# Patient Record
Sex: Male | Born: 1985 | Race: White | Hispanic: No | Marital: Single | Smoking: Never smoker
Health system: Southern US, Community
[De-identification: ages and names within clinical notes are randomized; demographics above are authoritative.]

## PROBLEM LIST (undated history)

## (undated) DIAGNOSIS — I10 Essential (primary) hypertension: Secondary | ICD-10-CM

## (undated) DIAGNOSIS — K219 Gastro-esophageal reflux disease without esophagitis: Secondary | ICD-10-CM

## (undated) DIAGNOSIS — F419 Anxiety disorder, unspecified: Secondary | ICD-10-CM

## (undated) HISTORY — PX: ABDOMINAL SURGERY: SHX537

## (undated) HISTORY — PX: OTHER SURGICAL HISTORY: SHX169

---

## 2017-08-23 ENCOUNTER — Encounter (HOSPITAL_COMMUNITY): Payer: Self-pay

## 2017-08-23 ENCOUNTER — Emergency Department (HOSPITAL_COMMUNITY): Payer: PRIVATE HEALTH INSURANCE

## 2017-08-23 ENCOUNTER — Other Ambulatory Visit: Payer: Self-pay

## 2017-08-23 ENCOUNTER — Emergency Department (HOSPITAL_COMMUNITY)
Admission: EM | Admit: 2017-08-23 | Discharge: 2017-08-24 | Disposition: A | Discharge status: Home / Self Care | Payer: PRIVATE HEALTH INSURANCE | Attending: Emergency Medicine | Admitting: Emergency Medicine

## 2017-08-23 DIAGNOSIS — R569 Unspecified convulsions: Secondary | ICD-10-CM | POA: Insufficient documentation

## 2017-08-23 DIAGNOSIS — I10 Essential (primary) hypertension: Secondary | ICD-10-CM | POA: Insufficient documentation

## 2017-08-23 HISTORY — DX: Essential (primary) hypertension: I10

## 2017-08-23 HISTORY — DX: Gastro-esophageal reflux disease without esophagitis: K21.9

## 2017-08-23 LAB — DIFFERENTIAL
ABS IMMATURE GRANULOCYTES: 0 10*3/uL (ref 0.0–0.1)
BASOS ABS: 0.1 10*3/uL (ref 0.0–0.1)
BASOS PCT: 1 %
EOS ABS: 0 10*3/uL (ref 0.0–0.7)
EOS PCT: 1 %
IMMATURE GRANULOCYTES: 0 %
LYMPHS ABS: 1.1 10*3/uL (ref 0.7–4.0)
Lymphocytes Relative: 13 %
Monocytes Absolute: 0.9 10*3/uL (ref 0.1–1.0)
Monocytes Relative: 11 %
NEUTROS PCT: 74 %
Neutro Abs: 6.4 10*3/uL (ref 1.7–7.7)

## 2017-08-23 LAB — BASIC METABOLIC PANEL
Anion gap: 16 — ABNORMAL HIGH (ref 5–15)
BUN: 10 mg/dL (ref 6–20)
CALCIUM: 9.6 mg/dL (ref 8.9–10.3)
CO2: 28 mmol/L (ref 22–32)
CREATININE: 0.95 mg/dL (ref 0.61–1.24)
Chloride: 90 mmol/L — ABNORMAL LOW (ref 98–111)
GFR calc non Af Amer: >60 mL/min (ref >60)
Glucose, Bld: 115 mg/dL — ABNORMAL HIGH (ref 70–99)
Potassium: 3.5 mmol/L (ref 3.5–5.1)
SODIUM: 134 mmol/L — AB (ref 135–145)

## 2017-08-23 LAB — CBC
HCT: 45.5 % (ref 39.0–52.0)
Hemoglobin: 15.5 g/dL (ref 13.0–17.0)
MCH: 31.5 pg (ref 26.0–34.0)
MCHC: 34.1 g/dL (ref 30.0–36.0)
MCV: 92.5 fL (ref 78.0–100.0)
PLATELETS: 202 10*3/uL (ref 150–400)
RBC: 4.92 MIL/uL (ref 4.22–5.81)
RDW: 12.9 % (ref 11.5–15.5)
WBC: 8.6 10*3/uL (ref 4.0–10.5)

## 2017-08-23 LAB — MAGNESIUM: MAGNESIUM: 1.8 mg/dL (ref 1.7–2.4)

## 2017-08-23 LAB — CBG MONITORING, ED: Glucose-Capillary: 111 mg/dL — ABNORMAL HIGH (ref 70–99)

## 2017-08-23 MED ORDER — LORAZEPAM 1 MG PO TABS
1.0000 mg | ORAL_TABLET | Freq: Once | ORAL | Status: AC
  Administered 2017-08-23: 1 mg via ORAL
  Filled 2017-08-23: qty 1

## 2017-08-23 NOTE — ED Triage Notes (Signed)
Pt BIB [facility] for eval of seizure episode this evening around 2000 lasting approx 5 minutes according to bystanders w/ NO hx of seziures. Pt reports he takes xanax daily and has not missed any doses. Pt arrives w/ GCS 15 and A&Ox4, EMS reports prolonged post-ictal period lasting approx 30-40 minutes.

## 2017-08-23 NOTE — ED Provider Notes (Signed)
[facility]   CSN: [id number] Arrival date & time: 08/23/17  2059  History   Chief Complaint Chief Complaint  Patient presents with  . Seizures    HPI Jenkins RougeBrent Gurka is a 32 y.o. male.  HPI 32 year old male with history of hypertension, GERD, and alcohol dependence presents to the emergency department today for evaluation of seizure-like activity.  Reports he was leaving work today whenever he felt lightheaded and nauseated.  Reportedly had a approx 5-minute episode of seizure-like activity that was witnessed by bystanders.  Had approximately 30 to 40-minute postictal period following event.  Received no medications prior to arrival.  Has been alert and oriented with EMS.  Patient complaining of headache.  No witnesses available in the ED for collateral.  Patient uncertain if he struck his head.  No lacerations or abrasions.  No history of seizures in the past.  No history of alcohol-related complications or hospitalizations in the past.  Reports he drinks probably 3-4 liquor drinks nightly.  Often takes Xanax daily while at work and is uncertain if he took it today.  Typically takes 0.5 mg each morning at work.  Typically gets off work at 3p however did not get to leave until 9p tonight. No fevers or chills.  No chest pain or shortness of breath.  No recent falls, traumas or accidents prior to today's event. In ED complaining of tremulousness and "my nerves are really bad." No SI/HI.   Past Medical History:  Diagnosis Date  . GERD (gastroesophageal reflux disease)   . HTN (hypertension)     There are no active problems to display for this patient.   History reviewed. No pertinent surgical history.      Home Medications    Prior to Admission medications   Medication Sig Start Date End Date Taking? Authorizing Provider  ALPRAZolam (XANAX) 0.25 MG tablet Take 0.25 mg by mouth 2 (two) times daily. 08/07/17  Yes [provider]  omeprazole (PRILOSEC OTC) 20 MG tablet Take 20 mg by mouth daily.   Yes [provider]    Family History History reviewed. No pertinent family history.  Social History Social History   Tobacco Use  . Smoking status: Never Smoker  . Smokeless tobacco: Never Used  Substance Use Topics  . Alcohol use: Yes    Comment: 3-4 shots of vodka every night  . Drug use: Never     Allergies   Patient has no known allergies.   Review of Systems Review of Systems  Constitutional: Negative for chills and fever.  HENT: Negative for congestion.   Eyes: Negative for visual disturbance.  Respiratory: Negative for cough and shortness of breath.   Cardiovascular: Negative for chest pain and leg swelling.  Gastrointestinal: Positive for nausea. Negative for abdominal pain, diarrhea and vomiting.  Genitourinary: Negative for dysuria.  Musculoskeletal: Negative for back pain and neck pain.  Skin: Negative for rash.  Neurological: Positive for seizures, light-headedness and headaches. Negative for weakness and numbness.       Tremulousness  Psychiatric/Behavioral: The patient is nervous/anxious.      Physical Exam Updated Vital Signs BP (!) 144/96   Pulse (!) 106   Temp 98.4 F (36.9 C)   Resp 14   Ht 6\' 3"  (1.905 m)   Wt 93 kg   SpO2 95%   BMI 25.62 kg/m   Physical Exam  Constitutional: He is oriented to person, place, and time. No distress.  HENT:  Head: Normocephalic and atraumatic.  Eyes: Conjunctivae are normal. Right eye exhibits no discharge. Left eye exhibits no discharge.  Neck: Normal range of motion. No tracheal deviation present.  Cardiovascular: Regular rhythm, normal heart sounds and intact distal pulses.  Pulmonary/Chest: Effort normal and breath sounds normal. No respiratory distress.  Abdominal: Soft. Bowel sounds are normal. He exhibits no distension. There is no tenderness.  Musculoskeletal: He exhibits no edema or deformity.    Neurological: He is alert and oriented to person, place, and time. He exhibits normal muscle tone.  Fine tremor diffusely. Alert and oriented x3.  Cranial nerves II-XII intact.  No facial asymmetry.  5/5 grip strength bilaterally.  5/5 bicep flexion and tricep extension bilaterally. 5/5 flexion/extension at knee, hip, and ankles. No discoordination of FNF, heel to shin, and ambulates without ataxia or antalgic gait.    Skin: No rash noted. He is not diaphoretic.  Psychiatric: He has a normal mood and affect.     ED Treatments / Results  Labs (all labs ordered are listed, but only abnormal results are displayed) Labs Reviewed  BASIC METABOLIC PANEL - Abnormal; Notable for the following components:      Result Value   Sodium 134 (*)    Chloride 90 (*)    Glucose, Bld 115 (*)    Anion gap 16 (*)    All other components within normal limits  CBG MONITORING, ED - Abnormal; Notable for the following components:   Glucose-Capillary 111 (*)    All other components within normal limits  CBC  MAGNESIUM  DIFFERENTIAL    EKG EKG Interpretation  Date/Time:  Friday August 23 2017 21:06:38 EDT Ventricular Rate:  116 PR Interval:    QRS Duration: 102 QT Interval:  339 QTC Calculation: 471 R Axis:   59 Text Interpretation:  Sinus tachycardia Borderline T abnormalities, inferior leads Borderline prolonged QT interval No old tracing to compare Confirmed by Pricilla Loveless [phone]) on 08/23/2017 9:28:13 PM   Radiology Ct Head Wo Contrast  Result Date: 08/23/2017 CLINICAL DATA:  Seizure this evening.  No history of seizures. EXAM: CT HEAD WITHOUT CONTRAST TECHNIQUE: Contiguous axial images were obtained from the base of the skull through the vertex without intravenous contrast. COMPARISON:  None. FINDINGS: Brain: Normal appearing cerebral hemispheres and posterior fossa structures. Normal size and position of the ventricles. No intracranial hemorrhage, mass lesion or CT evidence of acute  infarction. Vascular: No hyperdense vessel or unexpected calcification. Skull: Normal. Negative for fracture or focal lesion. Sinuses/Orbits: Unremarkable. Other: None. IMPRESSION: Normal examination. Electronically Signed   By: Beckie Salts M.D.   On: 08/23/2017 22:05    Procedures Procedures (including critical care time)  Medications Ordered in ED Medications  LORazepam (ATIVAN) tablet 1 mg (1 mg Oral Given 08/23/17 2137)     Initial Impression / Assessment and Plan / ED Course  I have reviewed the triage vital signs and the nursing notes.  Pertinent labs & imaging results that were available during my care of the patient were reviewed by me and considered in my medical decision making (see chart for details).    32 year old male with history of hypertension, GERD, and alcohol dependence presents to the emergency department today for evaluation of seizure-like activity.   Patient afebrile, initially tachycardic and tremulous on presentation.  No focal neurological deficits.  Ambulates without difficulty.  Uncertain if he struck his head.  Will obtain CT head given new onset seizures and possible head injury with headache. No infectious symptoms  to suggest meningitis/encephalitis. We will also check CBC and metabolic panel to evaluate for organic etiologies.  Suspect likely EtOH/benzo withdrawal.  No history of similar withdrawal or seizures in the past.  Not actively hallucinating.  CT head obtained shows no acute intercranial ab normality.  No mass.  No bleed.  CBC and CMP reviewed. CBC unremarkable. CMP remarkable only for chloride 90 and AGAP 16. Tolerating PO now in ED. Pt given PO ativan with resolution of tremor and tachycardia. Favor benzo/alcohol dependence and possibly withdrawal causing seizure. Pt counseled on this. Not interested in quitting now. Advised [facility] for assistance. Also counseled on seizure precautions (do not drive, etc) until follow up with neuro. Neuro  contact info provided. No other concerns at this time. Stable at discharge and clinically sober with clear speech and steady gait.   Case and plan of care discussed with Dr. Criss Alvine.  Final Clinical Impressions(s) / ED Diagnoses   Final diagnoses:  Seizure [facility])    ED Discharge Orders    None       Rigoberto Noel, MD 08/23/17 Joseph Pierini    Pricilla Loveless, MD 08/24/17 [phone]

## 2017-08-23 NOTE — Discharge Instructions (Signed)
Follow-up with your primary care doctor for reevaluation.  Continue taking home medications as previously prescribed.  Do not drive, operate heavy machinery, swim alone, or climb ladders or heights until you have followed up with neurology for seizure.  Recommend talking with [facility] or other drug/alcohol recovery center for alcohol dependence.

## 2018-11-05 ENCOUNTER — Other Ambulatory Visit: Payer: Self-pay

## 2018-11-05 ENCOUNTER — Emergency Department (HOSPITAL_COMMUNITY): Payer: PRIVATE HEALTH INSURANCE

## 2018-11-05 ENCOUNTER — Emergency Department (HOSPITAL_COMMUNITY)
Admission: EM | Admit: 2018-11-05 | Discharge: 2018-11-06 | Discharge status: Against Medical Advice | Payer: PRIVATE HEALTH INSURANCE | Attending: Emergency Medicine | Admitting: Emergency Medicine

## 2018-11-05 ENCOUNTER — Encounter (HOSPITAL_COMMUNITY): Payer: Self-pay

## 2018-11-05 DIAGNOSIS — R000 Tachycardia, unspecified: Secondary | ICD-10-CM | POA: Diagnosis not present

## 2018-11-05 DIAGNOSIS — R7989 Other specified abnormal findings of blood chemistry: Secondary | ICD-10-CM

## 2018-11-05 DIAGNOSIS — I10 Essential (primary) hypertension: Secondary | ICD-10-CM | POA: Diagnosis not present

## 2018-11-05 DIAGNOSIS — Z79899 Other long term (current) drug therapy: Secondary | ICD-10-CM | POA: Diagnosis not present

## 2018-11-05 DIAGNOSIS — R197 Diarrhea, unspecified: Secondary | ICD-10-CM | POA: Insufficient documentation

## 2018-11-05 DIAGNOSIS — F419 Anxiety disorder, unspecified: Secondary | ICD-10-CM | POA: Insufficient documentation

## 2018-11-05 DIAGNOSIS — R1110 Vomiting, unspecified: Secondary | ICD-10-CM

## 2018-11-05 DIAGNOSIS — R2681 Unsteadiness on feet: Secondary | ICD-10-CM | POA: Insufficient documentation

## 2018-11-05 HISTORY — DX: Anxiety disorder, unspecified: F41.9

## 2018-11-05 LAB — COMPREHENSIVE METABOLIC PANEL
ALT: 71 U/L — ABNORMAL HIGH (ref 0–44)
AST: 108 U/L — ABNORMAL HIGH (ref 15–41)
Albumin: 4.6 g/dL (ref 3.5–5.0)
Alkaline Phosphatase: 62 U/L (ref 38–126)
Anion gap: 12 (ref 5–15)
BUN: 7 mg/dL (ref 6–20)
CO2: 27 mmol/L (ref 22–32)
Calcium: 9.6 mg/dL (ref 8.9–10.3)
Chloride: 94 mmol/L — ABNORMAL LOW (ref 98–111)
Creatinine, Ser: 0.94 mg/dL (ref 0.61–1.24)
GFR calc Af Amer: >60 mL/min (ref >60)
GFR calc non Af Amer: >60 mL/min (ref >60)
Glucose, Bld: 110 mg/dL — ABNORMAL HIGH (ref 70–99)
Potassium: 4 mmol/L (ref 3.5–5.1)
Sodium: 133 mmol/L — ABNORMAL LOW (ref 135–145)
Total Bilirubin: 2.4 mg/dL — ABNORMAL HIGH (ref 0.3–1.2)
Total Protein: 8.8 g/dL — ABNORMAL HIGH (ref 6.5–8.1)

## 2018-11-05 LAB — URINALYSIS, ROUTINE W REFLEX MICROSCOPIC
Bilirubin Urine: NEGATIVE
Glucose, UA: NEGATIVE mg/dL
Ketones, ur: NEGATIVE mg/dL
Leukocytes,Ua: NEGATIVE
Nitrite: NEGATIVE
Protein, ur: 100 mg/dL — AB
Specific Gravity, Urine: 1.01 (ref 1.005–1.030)
pH: 6 (ref 5.0–8.0)

## 2018-11-05 LAB — CBC
HCT: 52.5 % — ABNORMAL HIGH (ref 39.0–52.0)
Hemoglobin: 18 g/dL — ABNORMAL HIGH (ref 13.0–17.0)
MCH: 32.6 pg (ref 26.0–34.0)
MCHC: 34.3 g/dL (ref 30.0–36.0)
MCV: 95.1 fL (ref 80.0–100.0)
Platelets: 159 10*3/uL (ref 150–400)
RBC: 5.52 MIL/uL (ref 4.22–5.81)
RDW: 12.8 % (ref 11.5–15.5)
WBC: 5 10*3/uL (ref 4.0–10.5)
nRBC: 0 % (ref 0.0–0.2)

## 2018-11-05 LAB — LIPASE, BLOOD: Lipase: 111 U/L — ABNORMAL HIGH (ref 11–51)

## 2018-11-05 MED ORDER — LACTATED RINGERS IV BOLUS
1000.0000 mL | Freq: Once | INTRAVENOUS | Status: AC
  Administered 2018-11-05: 1000 mL via INTRAVENOUS

## 2018-11-05 MED ORDER — LORAZEPAM 2 MG/ML IJ SOLN
2.0000 mg | Freq: Once | INTRAMUSCULAR | Status: AC
  Administered 2018-11-05: 2 mg via INTRAVENOUS
  Filled 2018-11-05: qty 1

## 2018-11-05 MED ORDER — LORAZEPAM 2 MG/ML IJ SOLN
1.0000 mg | Freq: Once | INTRAMUSCULAR | Status: AC
  Administered 2018-11-05: 1 mg via INTRAVENOUS
  Filled 2018-11-05: qty 1

## 2018-11-05 MED ORDER — ONDANSETRON HCL 4 MG/2ML IJ SOLN
4.0000 mg | Freq: Once | INTRAMUSCULAR | Status: AC
  Administered 2018-11-05: 4 mg via INTRAVENOUS
  Filled 2018-11-05: qty 2

## 2018-11-05 MED ORDER — CHLORDIAZEPOXIDE HCL 25 MG PO CAPS
50.0000 mg | ORAL_CAPSULE | Freq: Once | ORAL | Status: AC
  Administered 2018-11-05: 50 mg via ORAL
  Filled 2018-11-05: qty 2

## 2018-11-05 MED ORDER — SODIUM CHLORIDE 0.9% FLUSH
3.0000 mL | Freq: Once | INTRAVENOUS | Status: AC
  Administered 2018-11-05: 3 mL via INTRAVENOUS

## 2018-11-05 NOTE — ED Provider Notes (Signed)
[facility]-EMERGENCY DEPT Provider Note   CSN: [id number] Arrival date & time: 11/05/18  1708     History   Chief Complaint Chief Complaint  Patient presents with  . Emesis  . Diarrhea    HPI Connor Lopez is a 33 y.o. male.     HPI  33 year old male presents with vomiting.  Has been ongoing for about 2 or 3 days.  He is having a hard time sleeping due to this.  He can keep down Gatorade but water or food he vomits back up.  No hematemesis.  Had a little bit of diarrhea earlier but that has resolved.  No abdominal pain.  He is feeling pretty anxious, which he states is due to being in the hospital itself.  He drinks about 3 shots of alcohol but not every day.  He has not had any recently and slowly weaned himself off.  However he thinks his anxiety is a recurrent anxiety and problem rather than withdrawal. Typically takes 0.25 mg xanax BID but states it's not helping his anxiety/sleep.  Past Medical History:  Diagnosis Date  . Anxiety   . GERD (gastroesophageal reflux disease)   . HTN (hypertension)     There are no active problems to display for this patient.   Past Surgical History:  Procedure Laterality Date  . ABDOMINAL SURGERY    . head surgery          Home Medications    Prior to Admission medications   Medication Sig Start Date End Date Taking? Authorizing Provider  ALPRAZolam (XANAX) 0.25 MG tablet Take 0.25 mg by mouth 2 (two) times daily as needed for anxiety.  08/07/17  Yes [provider]  lisinopril (ZESTRIL) 10 MG tablet Take 10 mg by mouth daily. 08/13/18  Yes [provider]  pantoprazole (PROTONIX) 40 MG tablet Take 40 mg by mouth 2 (two) times daily.   Yes [provider]    Family History Family History  Family history unknown: Yes    Social History Social History   Tobacco Use  . Smoking status: Never Smoker  . Smokeless tobacco: Never Used  Substance Use Topics  . Alcohol use: Yes   . Drug use: Never     Allergies   Klonopin [clonazepam]   Review of Systems Review of Systems  Constitutional: Negative for fever.  Respiratory: Negative for cough and shortness of breath.   Gastrointestinal: Positive for diarrhea, nausea and vomiting. Negative for abdominal pain.  Psychiatric/Behavioral: The patient is nervous/anxious.   All other systems reviewed and are negative.    Physical Exam Updated Vital Signs BP (!) 142/113   Pulse (!) 103   Temp 98 F (36.7 C) (Oral)   Resp 18   Ht 6\' 2"  (1.88 m)   Wt 105.2 kg   SpO2 96%   BMI 29.79 kg/m   Physical Exam Vitals signs and nursing note reviewed.  Constitutional:      Appearance: He is well-developed.  HENT:     Head: Normocephalic and atraumatic.     Right Ear: External ear normal.     Left Ear: External ear normal.     Nose: Nose normal.  Eyes:     General:        Right eye: No discharge.        Left eye: No discharge.  Neck:     Musculoskeletal: Neck supple.  Cardiovascular:     Rate and Rhythm: Regular rhythm. Tachycardia present.  Heart sounds: Normal heart sounds.  Pulmonary:     Effort: Pulmonary effort is normal.     Breath sounds: Normal breath sounds.  Abdominal:     Palpations: Abdomen is soft.     Tenderness: There is no abdominal tenderness.  Skin:    General: Skin is warm and dry.  Neurological:     Mental Status: He is alert.     Motor: No tremor.  Psychiatric:        Mood and Affect: Mood is anxious.      ED Treatments / Results  Labs (all labs ordered are listed, but only abnormal results are displayed) Labs Reviewed  LIPASE, BLOOD - Abnormal; Notable for the following components:      Result Value   Lipase 111 (*)    All other components within normal limits  COMPREHENSIVE METABOLIC PANEL - Abnormal; Notable for the following components:   Sodium 133 (*)    Chloride 94 (*)    Glucose, Bld 110 (*)    Total Protein 8.8 (*)    AST 108 (*)    ALT 71 (*)    Total  Bilirubin 2.4 (*)    All other components within normal limits  CBC - Abnormal; Notable for the following components:   Hemoglobin 18.0 (*)    HCT 52.5 (*)    All other components within normal limits  URINALYSIS, ROUTINE W REFLEX MICROSCOPIC - Abnormal; Notable for the following components:   Color, Urine AMBER (*)    Hgb urine dipstick SMALL (*)    Protein, ur 100 (*)    Bacteria, UA RARE (*)    All other components within normal limits    EKG None  Radiology No results found.  Procedures Procedures (including critical care time)  Medications Ordered in ED Medications  chlordiazePOXIDE (LIBRIUM) capsule 50 mg (has no administration in time range)  sodium chloride flush (NS) 0.9 % injection 3 mL (3 mLs Intravenous Given 11/05/18 2047)  lactated ringers bolus 1,000 mL (0 mLs Intravenous Stopped 11/05/18 2134)  lactated ringers bolus 1,000 mL (1,000 mLs Intravenous Bolus from Bag 11/05/18 2134)  LORazepam (ATIVAN) injection 1 mg (1 mg Intravenous Given 11/05/18 2047)  ondansetron (ZOFRAN) injection 4 mg (4 mg Intravenous Given 11/05/18 2047)  LORazepam (ATIVAN) injection 2 mg (2 mg Intravenous Given 11/05/18 2132)     Initial Impression / Assessment and Plan / ED Course  I have reviewed the triage vital signs and the nursing notes.  Pertinent labs & imaging results that were available during my care of the patient were reviewed by me and considered in my medical decision making (see chart for details).        Family is here and states that he last drank Sunday, about 3 days ago.  Symptoms have started since then.  Some tremors.  He probably does have a mild alcohol withdrawal and his CIWA is a 9.  He will be given Ativan and started on some Librium.  I think with the abnormal LFTs and mild bump in lipase, will get right upper quadrant ultrasound.  However this could all be viral and/or alcohol related. Care transferred to [facility].  Final Clinical Impressions(s) /  ED Diagnoses   Final diagnoses:  Vomiting in adult    ED Discharge Orders    None       Sherwood Gambler, MD 11/05/18 2207

## 2018-11-05 NOTE — ED Notes (Signed)
Ultrasound bedside.

## 2018-11-05 NOTE — ED Notes (Signed)
PT SPOKE WITH THIS WRITER AND IS VERY NERVOUS. HE DOES NOT LIKE HOSPITALS AND DID NOT TAKE HIS ANXIETY MEDICATION TODAY. PT REQUESTED TO STEP OUTSIDE TO HELP ANXIETY. ENCOURAGED PT TO NOTIFY THIS WRITER OR REGISTRATION SO WE CAN BETTER ASSIST WITH HIS PLAN OF CARE.

## 2018-11-05 NOTE — ED Notes (Signed)
Pt slightly unsteady on feet. Pt requesting to use restroom. RN offered the urinal or to use the wheelchair to wheel patient to bathroom door. Pt refused. Pt educated that staff wants to prevent fall. Britni Henderley, PA

## 2018-11-05 NOTE — ED Provider Notes (Signed)
Care transferred from Dr. Criss Alvine.  See note for full HPI.  In summation 33 year old male presents for evaluation of emesis.  Has been ongoing over the last 2 to 3 days.  States he can keep down Gatorade however has emesis with solid food.  No hematemesis, no history of esophageal varices.  He does drink about 3 shots of liquor however not daily.  He has been slowly trying to wean himself.  He denies prior history of DTs or withdrawal seizures.  He typically takes 0.25 mg Xanax twice daily or does not help with his anxiety.  With elevated LFTs on labs.  Plan right upper quadrant ultrasound, Librium, reevaluation of tachycardia.  Likely DC home.  Physical Exam  BP (!) 141/106   Pulse 100   Temp 98 F (36.7 C) (Oral)   Resp 18   Ht 6\' 2"  (1.88 m)   Wt 105.2 kg   SpO2 96%   BMI 29.79 kg/m   Physical Exam Vitals signs and nursing note reviewed.  Constitutional:      General: He is not in acute distress.    Appearance: He is well-developed. He is not ill-appearing, toxic-appearing or diaphoretic.  HENT:     Head: Normocephalic and atraumatic.     Nose: Nose normal.     Mouth/Throat:     Mouth: Mucous membranes are moist.     Pharynx: Oropharynx is clear.  Eyes:     Pupils: Pupils are equal, round, and reactive to light.  Neck:     Musculoskeletal: Normal range of motion and neck supple.  Cardiovascular:     Rate and Rhythm: Normal rate and regular rhythm.     Pulses: Normal pulses.     Heart sounds: Normal heart sounds.  Pulmonary:     Effort: Pulmonary effort is normal. No respiratory distress.  Abdominal:     General: Bowel sounds are normal. There is no distension.     Palpations: Abdomen is soft.     Tenderness: There is no abdominal tenderness. There is no right CVA tenderness, left CVA tenderness, guarding or rebound.  Musculoskeletal: Normal range of motion.  Skin:    General: Skin is warm and dry.  Neurological:     Mental Status: He is alert.    ED  Course/Procedures     Procedures Abdomen Limited Ruq  Result Date: 11/05/2018 CLINICAL DATA:  Vomiting EXAM: ULTRASOUND ABDOMEN LIMITED RIGHT UPPER QUADRANT COMPARISON:  None. FINDINGS: Gallbladder: No gallstones or wall thickening visualized. No sonographic Murphy sign noted by sonographer. Common bile duct: Diameter: 6 mm Liver: Increased echotexture seen throughout. No focal abnormality or biliary ductal dilatation. Portal vein is patent on color Doppler imaging with normal direction of blood flow towards the liver. Other: None. IMPRESSION: Hepatic steatosis. Nonspecific mild prominence to the common bile duct. Normal appearing gallbladder. Electronically Signed   By: 11/07/2018 M.D.   On: 11/05/2018 22:19   Labs Reviewed  LIPASE, BLOOD - Abnormal; Notable for the following components:      Result Value   Lipase 111 (*)    All other components within normal limits  COMPREHENSIVE METABOLIC PANEL - Abnormal; Notable for the following components:   Sodium 133 (*)    Chloride 94 (*)    Glucose, Bld 110 (*)    Total Protein 8.8 (*)    AST 108 (*)    ALT 71 (*)    Total Bilirubin 2.4 (*)    All other components within normal limits  CBC - Abnormal; Notable for the following components:   Hemoglobin 18.0 (*)    HCT 52.5 (*)    All other components within normal limits  URINALYSIS, ROUTINE W REFLEX MICROSCOPIC - Abnormal; Notable for the following components:   Color, Urine AMBER (*)    Hgb urine dipstick SMALL (*)    Protein, ur 100 (*)    Bacteria, UA RARE (*)    All other components within normal limits   MDM   Care transferred from Dr. Regenia Skeeter.  See note for full HPI.  In summation 33 year old male presents for evaluation of emesis.  Has been ongoing over the last 2 to 3 days.  States he can keep down Gatorade however has emesis with solid food.  No hematemesis, no history of esophageal varices.  He does drink about 3 shots of liquor however not daily.  He has been slowly  trying to wean himself.  He denies prior history of DTs or withdrawal seizures.  He typically takes 0.25 mg Xanax twice daily or does not help with his anxiety.  With elevated LFTs on labs.  Plan right upper quadrant ultrasound, Librium, reevaluation of tachycardia.  Likely DC home.  Initial evaluation patient has removed his IV and is standing in the doorway.  States he would like to go home now.  He does not want any further evaluation.  Discussed with patient his ultrasound did show mild nonspecific dilation of the CBD duct.  Discussed that there could be an obstructed stone which is been causing his emesis as well as elevated lipase level.  Discussed with patient I would like to consult with the gastroenterologist for possible additional imaging.  Patient states he does not want any of this done and would like to go home.  He has been able to take sips of ginger ale without emesis in the ED.  Discussed with patient that an obstructed stone could lead to infection, worsening pain.  Discussed risk versus benefit and of leaving the emergency department prior to completion of exam.  He voices understanding is chosen to leave [facility].  We discussed the nature and purpose, risks and benefits, as well as, the alternatives of treatment. Time was given to allow the opportunity to ask questions and consider their options, and after the discussion, the patient decided to refuse the offerred treatment. The patient was informed that refusal could lead to, but was not limited to, death, permanent disability, or severe pain. If present, I asked the relatives or significant others to dissuade them without success. Prior to refusing, I determined that the patient had the capacity to make their decision and understood the consequences of that decision. After refusal, I made every reasonable opportunity to treat them to the best of my ability.  The patient was notified that they may return to the emergency  department at any time for further treatment.         Shyonna Carlin A, PA-C 11/05/18 2341    Fredia Sorrow, MD 11/12/18 [phone]

## 2018-11-05 NOTE — ED Triage Notes (Signed)
Patient c/o emesis and diarrhea x 2 days. Patient denies any abdominal pain.  Patient states he is very anxious. Patient stated, "I do not like hospitals."  Patient states he normally drinks daily, and has been tapering down patient states he has not drank in 2 weeks.

## 2019-02-03 ENCOUNTER — Emergency Department (HOSPITAL_COMMUNITY)
Admission: EM | Admit: 2019-02-03 | Discharge: 2019-02-03 | Disposition: A | Discharge status: Home / Self Care | Payer: 59 | Attending: Emergency Medicine | Admitting: Emergency Medicine

## 2019-02-03 ENCOUNTER — Encounter (HOSPITAL_COMMUNITY): Payer: Self-pay | Admitting: Emergency Medicine

## 2019-02-03 ENCOUNTER — Emergency Department (HOSPITAL_COMMUNITY): Payer: 59

## 2019-02-03 ENCOUNTER — Other Ambulatory Visit: Payer: Self-pay

## 2019-02-03 DIAGNOSIS — Z79899 Other long term (current) drug therapy: Secondary | ICD-10-CM | POA: Diagnosis not present

## 2019-02-03 DIAGNOSIS — Y929 Unspecified place or not applicable: Secondary | ICD-10-CM | POA: Insufficient documentation

## 2019-02-03 DIAGNOSIS — Y939 Activity, unspecified: Secondary | ICD-10-CM | POA: Diagnosis not present

## 2019-02-03 DIAGNOSIS — Y999 Unspecified external cause status: Secondary | ICD-10-CM | POA: Insufficient documentation

## 2019-02-03 DIAGNOSIS — I10 Essential (primary) hypertension: Secondary | ICD-10-CM | POA: Insufficient documentation

## 2019-02-03 DIAGNOSIS — M546 Pain in thoracic spine: Secondary | ICD-10-CM

## 2019-02-03 DIAGNOSIS — W010XXA Fall on same level from slipping, tripping and stumbling without subsequent striking against object, initial encounter: Secondary | ICD-10-CM | POA: Diagnosis not present

## 2019-02-03 DIAGNOSIS — Z20822 Contact with and (suspected) exposure to covid-19: Secondary | ICD-10-CM | POA: Diagnosis not present

## 2019-02-03 DIAGNOSIS — S22080A Wedge compression fracture of T11-T12 vertebra, initial encounter for closed fracture: Secondary | ICD-10-CM | POA: Diagnosis not present

## 2019-02-03 DIAGNOSIS — R569 Unspecified convulsions: Secondary | ICD-10-CM

## 2019-02-03 DIAGNOSIS — F10239 Alcohol dependence with withdrawal, unspecified: Secondary | ICD-10-CM | POA: Diagnosis not present

## 2019-02-03 LAB — CBC WITH DIFFERENTIAL/PLATELET
Abs Immature Granulocytes: 0.04 10*3/uL (ref 0.00–0.07)
Basophils Absolute: 0.1 10*3/uL (ref 0.0–0.1)
Basophils Relative: 1 %
Eosinophils Absolute: 0 10*3/uL (ref 0.0–0.5)
Eosinophils Relative: 0 %
HCT: 47.2 % (ref 39.0–52.0)
Hemoglobin: 16.1 g/dL (ref 13.0–17.0)
Immature Granulocytes: 1 %
Lymphocytes Relative: 7 %
Lymphs Abs: 0.5 10*3/uL — ABNORMAL LOW (ref 0.7–4.0)
MCH: 33.3 pg (ref 26.0–34.0)
MCHC: 34.1 g/dL (ref 30.0–36.0)
MCV: 97.7 fL (ref 80.0–100.0)
Monocytes Absolute: 0.5 10*3/uL (ref 0.1–1.0)
Monocytes Relative: 8 %
Neutro Abs: 5.8 10*3/uL (ref 1.7–7.7)
Neutrophils Relative %: 83 %
Platelets: 131 10*3/uL — ABNORMAL LOW (ref 150–400)
RBC: 4.83 MIL/uL (ref 4.22–5.81)
RDW: 12.6 % (ref 11.5–15.5)
WBC: 7 10*3/uL (ref 4.0–10.5)
nRBC: 0 % (ref 0.0–0.2)

## 2019-02-03 LAB — COMPREHENSIVE METABOLIC PANEL
ALT: 72 U/L — ABNORMAL HIGH (ref 0–44)
AST: 114 U/L — ABNORMAL HIGH (ref 15–41)
Albumin: 4.5 g/dL (ref 3.5–5.0)
Alkaline Phosphatase: 58 U/L (ref 38–126)
Anion gap: 10 (ref 5–15)
BUN: 8 mg/dL (ref 6–20)
CO2: 27 mmol/L (ref 22–32)
Calcium: 9.2 mg/dL (ref 8.9–10.3)
Chloride: 96 mmol/L — ABNORMAL LOW (ref 98–111)
Creatinine, Ser: 0.7 mg/dL (ref 0.61–1.24)
GFR calc Af Amer: >60 mL/min (ref >60)
GFR calc non Af Amer: >60 mL/min (ref >60)
Glucose, Bld: 110 mg/dL — ABNORMAL HIGH (ref 70–99)
Potassium: 4.3 mmol/L (ref 3.5–5.1)
Sodium: 133 mmol/L — ABNORMAL LOW (ref 135–145)
Total Bilirubin: 1.6 mg/dL — ABNORMAL HIGH (ref 0.3–1.2)
Total Protein: 7.9 g/dL (ref 6.5–8.1)

## 2019-02-03 LAB — POC SARS CORONAVIRUS 2 AG -  ED: SARS Coronavirus 2 Ag: NEGATIVE

## 2019-02-03 LAB — RESPIRATORY PANEL BY RT PCR (FLU A&B, COVID)
Influenza A by PCR: NEGATIVE
Influenza B by PCR: NEGATIVE
SARS Coronavirus 2 by RT PCR: NEGATIVE

## 2019-02-03 LAB — ETHANOL: Alcohol, Ethyl (B): <10 mg/dL (ref <10)

## 2019-02-03 MED ORDER — THIAMINE HCL 100 MG/ML IJ SOLN: 100.0000 mg | Freq: Every day | INTRAMUSCULAR | Status: DC

## 2019-02-03 MED ORDER — HYDROCODONE-ACETAMINOPHEN 5-325 MG PO TABS: 1.0000 | ORAL_TABLET | ORAL | 0 refills | Status: DC | PRN

## 2019-02-03 MED ORDER — SODIUM CHLORIDE (PF) 0.9 % IJ SOLN
INTRAMUSCULAR | Status: AC
  Filled 2019-02-03: qty 50

## 2019-02-03 MED ORDER — LORAZEPAM 1 MG PO TABS: 0.0000 mg | ORAL_TABLET | Freq: Two times a day (BID) | ORAL | Status: DC

## 2019-02-03 MED ORDER — HYDROMORPHONE HCL 1 MG/ML IJ SOLN
1.0000 mg | Freq: Once | INTRAMUSCULAR | Status: AC
  Administered 2019-02-03: 1 mg via INTRAVENOUS
  Filled 2019-02-03: qty 1

## 2019-02-03 MED ORDER — LORAZEPAM 2 MG/ML IJ SOLN
0.0000 mg | Freq: Four times a day (QID) | INTRAMUSCULAR | Status: DC
  Administered 2019-02-03 (×2): 1 mg via INTRAVENOUS
  Filled 2019-02-03 (×2): qty 1

## 2019-02-03 MED ORDER — LORAZEPAM 2 MG/ML IJ SOLN
2.0000 mg | Freq: Once | INTRAMUSCULAR | Status: AC
  Administered 2019-02-03: 2 mg via INTRAVENOUS
  Filled 2019-02-03: qty 1

## 2019-02-03 MED ORDER — LORAZEPAM 2 MG/ML IJ SOLN: 0.0000 mg | Freq: Two times a day (BID) | INTRAMUSCULAR | Status: DC

## 2019-02-03 MED ORDER — KETOROLAC TROMETHAMINE 15 MG/ML IJ SOLN
15.0000 mg | Freq: Once | INTRAMUSCULAR | Status: AC
  Administered 2019-02-03: 15 mg via INTRAVENOUS
  Filled 2019-02-03: qty 1

## 2019-02-03 MED ORDER — IOHEXOL 350 MG/ML SOLN
100.0000 mL | Freq: Once | INTRAVENOUS | Status: AC | PRN
  Administered 2019-02-03: 100 mL via INTRAVENOUS

## 2019-02-03 MED ORDER — LORAZEPAM 1 MG PO TABS: 0.0000 mg | ORAL_TABLET | Freq: Four times a day (QID) | ORAL | Status: DC

## 2019-02-03 MED ORDER — HYDROCODONE-ACETAMINOPHEN 5-325 MG PO TABS
1.0000 | ORAL_TABLET | Freq: Once | ORAL | Status: AC
  Administered 2019-02-03: 1 via ORAL
  Filled 2019-02-03: qty 1

## 2019-02-03 MED ORDER — THIAMINE HCL 100 MG/ML IJ SOLN
Freq: Once | INTRAVENOUS | Status: AC
  Filled 2019-02-03: qty 1000

## 2019-02-03 MED ORDER — THIAMINE HCL 100 MG PO TABS
100.0000 mg | ORAL_TABLET | Freq: Every day | ORAL | Status: DC
  Administered 2019-02-03: 100 mg via ORAL
  Filled 2019-02-03: qty 1

## 2019-02-03 NOTE — ED Provider Notes (Signed)
Patient continues have back pain and was tachycardic but that improved after some Ativan.  The patient was unable to ambulate due to significant pain in his back.  We decided to order lumbar spine films and he has T11 and T12 compression fractures along with T3.  Would order a CT scan of his thoracic spine along with lumbar spine.  Patient may need admission for further control of pain and PT.  Wait to hear from neurosurgery.   Charlestine Night, PA-C 02/03/19 1413    Milagros Loll, MD 02/04/19 [phone]

## 2019-02-03 NOTE — ED Notes (Addendum)
Pt not able to ambulate 15 ft.  Pt complained of pain so severe that ambulation was not possible.  Pt's O2 was 93 and Pulse at 134.

## 2019-02-03 NOTE — ED Provider Notes (Signed)
Care transferred to from Lawyer, PA-C at shift change. See note for full HPI.  In summation 34 year old with alcohol abuse with possible W/D seizure who presents for evaluation after witnessed seizure at work. Has been seen in Ed for seizure before. Initially hypoxic CTA chest neg, No UR symptoms. COVID negative here. Hypoxia resolved, previous provider thought likely due to abnormal connection in leads. He did however complain of back pain. Compression fx on xray CT thoracic and lumbar pending.Vinny Costella PA- C with NS to evaluate, likly dc home if can ambulate with brace however NS will evaluate patient in ED regardless.   "Patient to ED with reported seizure x 1 tonight while at home. The patient does not have memory of events and gives permission to discuss his condition with [facility], significant other. She states the patient had a single seizure tonight that lasted about 2 minutes. She describes a post-ictal period of confusion. On chart review, he has had one seizure in the past felt likely related to alcohol withdrawal. The patient denies heavy alcohol use, "only on the weekends". [name] reports he drinks 10 'airplane' bottles of vodka every night and the last time he drank was 02/01/19 (seizure 02/02/19). [name] reports that EMS was called when he had the seizure but he refused transportation at that time. A short time after, he developed sharp, severe bilateral mid-back pain and EMS was called a second time and transported him to the ED. She states that she assisted him to the ground during the seizure but he may have hit his back on the open drawer of the dresser. He was able to get himself up and back in bed without pain, which started shortly after. He denies headache, nausea, SOB although a deep breath causes his back pain to worsen. No cough. No recent illness. "  CIWA 3, has not had to get Ativan here. Patient denies sx of W/D at this time.   Physical Exam  BP (!) 146/104   Pulse (!) 110    Temp 99.7 F (37.6 C) (Oral)   Resp 18   Ht 6\' 2"  (1.88 m)   Wt 105.2 kg   SpO2 94%   BMI 29.79 kg/m   Physical Exam Vitals and nursing note reviewed.  Constitutional:      General: He is not in acute distress.    Appearance: He is well-developed. He is not ill-appearing, toxic-appearing or diaphoretic.  HENT:     Head: Normocephalic and atraumatic.     Nose: Nose normal.     Mouth/Throat:     Mouth: Mucous membranes are moist.     Pharynx: Oropharynx is clear.  Eyes:     Pupils: Pupils are equal, round, and reactive to light.  Cardiovascular:     Rate and Rhythm: Regular rhythm. Tachycardia present.     Heart sounds: Normal heart sounds.     Comments: Mild tachy to 104 in room Pulmonary:     Effort: Pulmonary effort is normal. No respiratory distress.     Breath sounds: Normal breath sounds.     Comments: Clear to auscultation bilateral without wheeze, rhonchi or rales. Abdominal:     General: There is no distension.     Palpations: Abdomen is soft.     Comments: Soft, nontender without rebound or guarding  Musculoskeletal:        General: Normal range of motion.     Cervical back: Normal range of motion and neck supple.  Comments: Tenderness to lumbar spine.  Patient T SLO brace.  Skin:    General: Skin is warm and dry.     Capillary Refill: Capillary refill takes less than 2 seconds.     Comments: Capillary refill.  No evidence of DVT, no edema, erythema or warmth.  No tremors to extremities.  Neurological:     Mental Status: He is alert.     Comments: Negative finger to nose, heel to shin, cranial 2-12 grossly intact.  Ambulatory in room without difficulty.     ED Course/Procedures   Clinical Course as of Feb 02 1654  Tue Feb 03, 2019  [address], [place] with NS has evaluated, likely dc home after CT if pain controlled. If needs admission for pain control would like Hosp to admit to [facility]   [BH]    Clinical Course User Index [BH] Vashawn Ekstein A, PA-C     Procedures Labs Reviewed  CBC WITH DIFFERENTIAL/PLATELET - Abnormal; Notable for the following components:      Result Value   Platelets 131 (*)    Lymphs Abs 0.5 (*)    All other components within normal limits  COMPREHENSIVE METABOLIC PANEL - Abnormal; Notable for the following components:   Sodium 133 (*)    Chloride 96 (*)    Glucose, Bld 110 (*)    AST 114 (*)    ALT 72 (*)    Total Bilirubin 1.6 (*)    All other components within normal limits  RESPIRATORY PANEL BY RT PCR (FLU A&B, COVID)  ETHANOL  POC SARS CORONAVIRUS 2 AG -  ED  DG Lumbar Spine Complete  Result Date: 02/03/2019 CLINICAL DATA:  Seizure with fall and low back pain. EXAM: LUMBAR SPINE - COMPLETE 4+ VIEW COMPARISON:  Chest CT 02/03/2019 FINDINGS: Anterior wedge compression fracture involving the T12 vertebral body with approximately 50% loss of height. There is also a compression fracture along the superior endplate of Z02 with mild height loss. Degenerative endplate changes at H8-N2. Probable large Schmorl's node along the inferior endplate of L3 but difficult to exclude a compression fracture involving the L3 vertebral body. Alignment of lumbar spine is normal. Mild disc space narrowing at L2-L3. No evidence for a pars defect. IMPRESSION: 1. Compression fracture involving the T12 vertebral body with at least 50% loss of height. 2. Compression fracture involving the superior endplate of D78 with mild vertebral body height loss. 3. Cannot exclude compression fractures at L3. Electronically Signed   By: Markus Daft M.D.   On: 02/03/2019 13:42   CT Angio Chest PE W and/or Wo Contrast  Result Date: 02/03/2019 CLINICAL DATA:  Hypoxemia. Additional history provided: Patient fell at home, history of severe ETOH abuse, right back pain. EXAM: CT ANGIOGRAPHY CHEST WITH CONTRAST TECHNIQUE: Multidetector CT imaging of the chest was performed using the standard protocol during bolus administration of intravenous contrast.  Multiplanar CT image reconstructions and MIPs were obtained to evaluate the vascular anatomy. CONTRAST:  112mL OMNIPAQUE IOHEXOL 350 MG/ML SOLN COMPARISON:  Chest radiograph 02/03/2019 FINDINGS: CARDIOVASCULAR: Normal heart size.No pericardial effusion.No thoracic aortic aneurysm. Satisfactory opacification of the pulmonary arteries to the segmental level. Evaluation for pulmonary embolus is slightly limited due to respiratory motion artifact, particularly at the level of the lung bases. No pulmonary artery filling defect is identified to suggest pulmonary embolus. MEDIASTINUM/NODES: No enlarged mediastinal, hilar or axillary lymph nodes. The visualized thyroid gland is unremarkable. LUNGS/PLEURA: Mild opacity within the dependent aspect of the right upper lobe and  right lower lobe, as well as left lower lobe, with an appearance most consistent with incomplete atelectasis. No pleural effusion or pneumothorax. The central airways are grossly patent. UPPER ABDOMEN: Severe hepatic steatosis. MUSCULOSKELETAL: No acute bony abnormality. Review of the MIP images confirms the above findings. IMPRESSION: Evaluation for pulmonary embolus is slightly limited by respiratory motion artifact at the level of the lung bases. No pulmonary embolus is identified. Mild pulmonary opacities within the dependent right upper and lower lobes, as well as left lower lobe, with an appearance most consistent with incomplete atelectasis. Pneumonia is difficult to definitively exclude. Severe hepatic steatosis. Electronically Signed   By: Jackey Loge DO   On: 02/03/2019 07:32   CT Lumbar Spine Wo Contrast  Result Date: 02/03/2019 CLINICAL DATA:  34 year old male with low back pain. EXAM: CT LUMBAR SPINE WITHOUT CONTRAST TECHNIQUE: Multidetector CT imaging of the lumbar spine was performed without intravenous contrast administration. Multiplanar CT image reconstructions were also generated. COMPARISON:  Chest CT dated 02/02/2019 and lumbar  spine radiograph dated 02/03/2019. FINDINGS: Segmentation: 5 lumbar type vertebrae. Alignment: No acute subluxation.  Normal lumbar lordosis. Vertebrae: No acute lumbar spine fracture. Minimal old compression fracture of the inferior endplate of L3 versus a Schmorl's node. There is compression fracture of the superior endplate of T12 with greater than 50% loss of vertebral body height. This is likely acute or possibly subacute. There is transmits component of the fracture that extends to the anterior vertebral body cortex. There is approximately 7 mm retropulsion of the superior posterior cortex with associated moderate focal narrowing of the central canal. There is also mild compression fracture of the superior endplate of T11 with less than 20% loss of vertebral body height centrally. No retropulsed fragment noted at this level. The visualized posterior elements appear intact. Paraspinal and other soft tissues: No paravertebral fluid collection or hematoma. 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 There is symmetric excretion of contrast by both kidneys. There is fatty infiltration of the liver. Disc levels: Mild degenerative changes and bone spur at L2-L3. The neural foramina appear patent. IMPRESSION: 1. No acute/traumatic lumbar spine pathology. 2. Compression fracture of the T12 with greater than 50% loss of vertebral body height, likely acute or possibly subacute. There is a 7 mm retropulsed fragment of the superior posterior cortex with associated moderate focal narrowing of the central canal. 3. Compression fracture of the superior endplate of T11 with less than 20% loss of vertebral body height. No retropulsed fragment at this level. 4. Fatty liver and horseshoe kidney. Electronically Signed   By: Elgie Collard M.D.   On: 02/03/2019 16:19   DG Chest Portable 1 View  Result Date: 02/03/2019 CLINICAL DATA:  Chest pain EXAM: PORTABLE CHEST 1 VIEW COMPARISON:   None. FINDINGS: Shallow lung inflation with right basilar atelectasis. Cardiomediastinal contours are normal. No pleural effusion or pneumothorax. IMPRESSION: Right basilar atelectasis. Electronically Signed   By: Deatra Robinson M.D.   On: 02/03/2019 02:00   MDM  FU CT scan. Plan to likely dc home with brace after CT scan.  Labs personally reviewed and reassuring CTA chest reassuring Plain film lumbar with possible compression fractures, patient awaiting CT.  Neurosurgery has been consulted and they will evaluate.  Patient under CIWA protocol  1 dose of Ativan this morning however none over last 8 hours in ED stay.  He does have some mild tachycardia however no chest pain, shortness of breath.  Hypoxia self  resolved and he is 95% on room air with good waveform with ambulation.  Does not appear to be clinically in withdrawal on my evaluation.  Patient denies any upper respiratory symptoms.  Covid negative.  Neurosurgery, then he has reviewed the CT scan.  See note.  Patient follow-up outpatient.   Patient with reassuring imaging.  Seizure likely due to alcohol withdrawal.  He denies wanting to quit or Librium outpatient.  I have given him outpatient resources.  Will give small dose pain medicine given his compression fractures.  Here with reassuring neurologic exam without any focal deficits.  Lab and follow-up outpatient PCP.  Low suspicion for intracranial mass, hemorrhage, SAH as cause of his symptoms.  Tolerating p.o. intake.  Patient is adamant about discharging home.  He is ambulatory in room without difficulty.  He is not very forthright with his alcohol use however per previous provider notes patient with extensive alcoholism.  The patient has been appropriately medically screened and/or stabilized in the ED. I have low suspicion for any other emergent medical condition which would require further screening, evaluation or treatment in the ED or require inpatient management.  Patient is  hemodynamically stable and in no acute distress.  Patient able to ambulate in department prior to ED.  Evaluation does not show acute pathology that would require ongoing or additional emergent interventions while in the emergency department or further inpatient treatment.  I have discussed the diagnosis with the patient and answered all questions.  Pain is been managed while in the emergency department and patient has no further complaints prior to discharge.  Patient is comfortable with plan discussed in room and is stable for discharge at this time.  I have discussed strict return precautions for returning to the emergency department.  Patient was encouraged to follow-up with PCP/specialist refer to at discharge.       Rumaldo Difatta A, PA-C 02/03/19 1657    Lorre Nick, MD 02/03/19 1951

## 2019-02-03 NOTE — ED Notes (Signed)
Ortho tech called for TSLO brace. Ortho tech will order TSLO brace and may take a 1.5 hours.

## 2019-02-03 NOTE — Discharge Instructions (Addendum)
Follow up with Neurosurgery follow-up with neurosurgery ugery.,Aloe up with neurosurgery.

## 2019-02-03 NOTE — ED Provider Notes (Signed)
[facility]-EMERGENCY DEPT Provider Note   CSN: [id number] Arrival date & time: 02/03/19  0043     History No chief complaint on file.   Connor Lopez is a 34 y.o. male.  Patient to ED with reported seizure x 1 tonight while at home. The patient does not have memory of events and gives permission to discuss his condition with [facility], significant other. She states the patient had a single seizure tonight that lasted about 2 minutes. She describes a post-ictal period of confusion. On chart review, he has had one seizure in the past felt likely related to alcohol withdrawal. The patient denies heavy alcohol use, "only on the weekends". [name] reports he drinks 10 'airplane' bottles of vodka every night and the last time he drank was 02/01/19 (seizure 02/02/19). [name] reports that EMS was called when he had the seizure but he refused transportation at that time. A short time after, he developed sharp, severe bilateral mid-back pain and EMS was called a second time and transported him to the ED. She states that she assisted him to the ground during the seizure but he may have hit his back on the open drawer of the dresser. He was able to get himself up and back in bed without pain, which started shortly after. He denies headache, nausea, SOB although a deep breath causes his back pain to worsen. No cough. No recent illness.   The history is provided by the patient and a significant other. No language interpreter was used.       Past Medical History:  Diagnosis Date  . Anxiety   . GERD (gastroesophageal reflux disease)   . HTN (hypertension)     There are no problems to display for this patient.   Past Surgical History:  Procedure Laterality Date  . ABDOMINAL SURGERY    . head surgery         Family History  Family history unknown: Yes    Social History   Tobacco Use  . Smoking status: Never Smoker  . Smokeless tobacco: Never Used  Substance Use  Topics  . Alcohol use: Yes  . Drug use: Never    Home Medications Prior to Admission medications   Medication Sig Start Date End Date Taking? Authorizing Provider  ALPRAZolam (XANAX) 0.25 MG tablet Take 0.25 mg by mouth 2 (two) times daily as needed for anxiety.  08/07/17  Yes [provider]  lisinopril (ZESTRIL) 10 MG tablet Take 10 mg by mouth daily. 08/13/18  Yes [provider]    Allergies    Klonopin [clonazepam]  Review of Systems   Review of Systems  Constitutional: Negative for chills and fever.  HENT: Negative.  Negative for congestion.   Respiratory: Negative.  Negative for cough and shortness of breath.   Cardiovascular: Negative.   Gastrointestinal: Negative.  Negative for abdominal pain and vomiting.  Musculoskeletal: Positive for back pain.  Skin: Negative.   Neurological: Positive for seizures. Negative for weakness and headaches.    Physical Exam Updated Vital Signs BP (!) 150/101   Pulse (!) 106   Temp 99.7 F (37.6 C) (Oral)   Resp (!) 24   Ht 6\' 2"  (1.88 m)   Wt 105.2 kg   SpO2 93%   BMI 29.79 kg/m   Physical Exam Vitals and nursing note reviewed.  Constitutional:      General: He is not in acute distress.    Appearance: He is well-developed.  HENT:  Head: Normocephalic.  Cardiovascular:     Rate and Rhythm: Regular rhythm. Tachycardia present.  Pulmonary:     Effort: Pulmonary effort is normal.     Breath sounds: Normal breath sounds. No wheezing, rhonchi or rales.  Chest:     Chest wall: No tenderness.  Abdominal:     General: Bowel sounds are normal.     Palpations: Abdomen is soft.     Tenderness: There is no abdominal tenderness. There is no guarding or rebound.  Musculoskeletal:        General: No tenderness, deformity or signs of injury. Normal range of motion.     Cervical back: Normal range of motion and neck supple.       Back:     Comments: No bruising, redness or abrasion to back. No midline spinal  tenderness.   Skin:    General: Skin is warm and dry.     Findings: No rash.  Neurological:     General: No focal deficit present.     Mental Status: He is alert and oriented to person, place, and time.     ED Results / Procedures / Treatments   Labs (all labs ordered are listed, but only abnormal results are displayed) Labs Reviewed  CBC WITH DIFFERENTIAL/PLATELET  ETHANOL  COMPREHENSIVE METABOLIC PANEL  POC SARS CORONAVIRUS 2 AG -  ED   Results for orders placed or performed during the hospital encounter of 02/03/19  CBC with Differential  Result Value Ref Range   WBC 7.0 4.0 - 10.5 K/uL   RBC 4.83 4.22 - 5.81 MIL/uL   Hemoglobin 16.1 13.0 - 17.0 g/dL   HCT 47.0 96.2 - 83.6 %   MCV 97.7 80.0 - 100.0 fL   MCH 33.3 26.0 - 34.0 pg   MCHC 34.1 30.0 - 36.0 g/dL   RDW 62.9 47.6 - 54.6 %   Platelets 131 (L) 150 - 400 K/uL   nRBC 0.0 0.0 - 0.2 %   Neutrophils Relative % 83 %   Neutro Abs 5.8 1.7 - 7.7 K/uL   Lymphocytes Relative 7 %   Lymphs Abs 0.5 (L) 0.7 - 4.0 K/uL   Monocytes Relative 8 %   Monocytes Absolute 0.5 0.1 - 1.0 K/uL   Eosinophils Relative 0 %   Eosinophils Absolute 0.0 0.0 - 0.5 K/uL   Basophils Relative 1 %   Basophils Absolute 0.1 0.0 - 0.1 K/uL   Immature Granulocytes 1 %   Abs Immature Granulocytes 0.04 0.00 - 0.07 K/uL  Ethanol  Result Value Ref Range   Alcohol, Ethyl (B) <10 <10 mg/dL  Comprehensive metabolic panel  Result Value Ref Range   Sodium 133 (L) 135 - 145 mmol/L   Potassium 4.3 3.5 - 5.1 mmol/L   Chloride 96 (L) 98 - 111 mmol/L   CO2 27 22 - 32 mmol/L   Glucose, Bld 110 (H) 70 - 99 mg/dL   BUN 8 6 - 20 mg/dL   Creatinine, Ser 5.03 0.61 - 1.24 mg/dL   Calcium 9.2 8.9 - 54.6 mg/dL   Total Protein 7.9 6.5 - 8.1 g/dL   Albumin 4.5 3.5 - 5.0 g/dL   AST 568 (H) 15 - 41 U/L   ALT 72 (H) 0 - 44 U/L   Alkaline Phosphatase 58 38 - 126 U/L   Total Bilirubin 1.6 (H) 0.3 - 1.2 mg/dL   GFR calc non Af Amer >60 >60 mL/min   GFR calc Af Amer  >60 >60 mL/min  Anion gap 10 5 - 15    EKG None  Radiology DG Chest Portable 1 View  Result Date: 02/03/2019 CLINICAL DATA:  Chest pain EXAM: PORTABLE CHEST 1 VIEW COMPARISON:  None. FINDINGS: Shallow lung inflation with right basilar atelectasis. Cardiomediastinal contours are normal. No pleural effusion or pneumothorax. IMPRESSION: Right basilar atelectasis. Electronically Signed   By: Ulyses Jarred M.D.   On: 02/03/2019 02:00    Procedures Procedures (including critical care time)  Medications Ordered in ED Medications  LORazepam (ATIVAN) injection 0-4 mg (1 mg Intravenous Given 02/03/19 0357)    Or  LORazepam (ATIVAN) tablet 0-4 mg ( Oral See Alternative 02/03/19 0357)  LORazepam (ATIVAN) injection 0-4 mg (has no administration in time range)    Or  LORazepam (ATIVAN) tablet 0-4 mg (has no administration in time range)  thiamine tablet 100 mg (has no administration in time range)    Or  thiamine (B-1) injection 100 mg (has no administration in time range)  sodium chloride 0.9 % 1,000 mL with thiamine 419 mg, folic acid 1 mg, multivitamins adult 10 mL infusion ( Intravenous New Bag/Given 02/03/19 0358)  ketorolac (TORADOL) 15 MG/ML injection 15 mg (15 mg Intravenous Given 02/03/19 0353)    ED Course  I have reviewed the triage vital signs and the nursing notes.  Pertinent labs & imaging results that were available during my care of the patient were reviewed by me and considered in my medical decision making (see chart for details).    MDM Rules/Calculators/A&P                      4:50 - recheck - patient getting banana bag now. Still tachycardic to 115. He is on O2 at 4L and O2 saturation 89-91%. O2 turned to 6L = 94%. Heightened concern for PE. Will obtain CTA PE study. Labs remain pending.  Labs are stable. Mildly elevated LFT's, which are improved from previous. CTA pending. He is under CIWA protocol.  COVID still pending - ordered 2 hours ago, still uncollected.    Patient care signed out to [facility], PA-C, pending completion of work up.   DDx: alcohol withdrawal vs PE vs COVID  Final Clinical Impression(s) / ED Diagnoses Final diagnoses:  None   1. Back pain 2. Seizure 3. Alcohol dependence.  Rx / DC Orders ED Discharge Orders    None       Charlann Lange, Hershal Coria 02/03/19 Jenkins, April, MD 02/03/19 [phone]

## 2019-02-03 NOTE — Consult Note (Signed)
Chief Complaint   No chief complaint on file.   HPI   Consult requested by: chris lawyer PA-C, EDP [facility] Reason for consult: thoracic and lumbar compression fractures  HPI: Connor Lopez is a 34 y.o. male who presented to the ED with severe lower back pain. Patient reportedly had a ?witnessed seizure last night that lasted about 2 minutes, ?etoh w/d seizure. EMS was called initially but patient declined transport. Unfortunately, he developed severe lower back pain shortly there after and called EMS a second time for transport for evaluation. He underwent Xray of lumbar spine which revealed T11, T12 and possible L3 compression fractures. NSY consultation requested.   Complains mostly of bilateral lower back pain. No significant midline pain. No radicular symptoms or weakness in legs. No bowel/bladder dysfunction. Has already been fitted for brace. Has not tried to ambulate yet. Eager for d/c home.  There are no problems to display for this patient.   PMH: Past Medical History:  Diagnosis Date  . Anxiety   . GERD (gastroesophageal reflux disease)   . HTN (hypertension)     PSH: Past Surgical History:  Procedure Laterality Date  . ABDOMINAL SURGERY    . head surgery      (Not in a hospital admission)   SH: Social History   Tobacco Use  . Smoking status: Never Smoker  . Smokeless tobacco: Never Used  Substance Use Topics  . Alcohol use: Yes  . Drug use: Never    MEDS: Prior to Admission medications   Medication Sig Start Date End Date Taking? Authorizing Provider  ALPRAZolam (XANAX) 0.25 MG tablet Take 0.25 mg by mouth 2 (two) times daily as needed for anxiety.  08/07/17  Yes [provider]  lisinopril (ZESTRIL) 10 MG tablet Take 10 mg by mouth daily. 08/13/18  Yes [provider]    ALLERGY: Allergies  Allergen Reactions  . Klonopin [Clonazepam] Nausea Only    Social History   Tobacco Use  . Smoking status: Never Smoker  . Smokeless  tobacco: Never Used  Substance Use Topics  . Alcohol use: Yes     Family History  Family history unknown: Yes     ROS   Review of Systems  Constitutional: Negative.   HENT: Negative.   Eyes: Negative.   Respiratory: Negative.   Cardiovascular: Negative.   Gastrointestinal: Negative.   Genitourinary: Negative.   Musculoskeletal: Positive for back pain and myalgias.  Skin: Negative.   Neurological: Positive for seizures. Negative for dizziness, tingling, tremors, sensory change, speech change, focal weakness, weakness and headaches.    Exam   Vitals:   02/03/19 1505 02/03/19 1515  BP:    Pulse: (!) 105 (!) 110  Resp:    Temp:    SpO2: 90% 94%   General appearance: WDWN, NAD, in TLSO brace Eyes: No scleral injection Cardiovascular: Regular rate and rhythm without murmurs, rubs, gallops. No edema or variciosities. Distal pulses normal. Pulmonary: Effort normal, non-labored breathing Musculoskeletal:     Muscle tone upper extremities: Normal    Muscle tone lower extremities: Normal    Motor exam: Upper Extremities Deltoid Bicep Tricep Grip  Right 5/5 5/5 5/5 5/5  Left 5/5 5/5 5/5 5/5   Lower Extremity IP Quad PF DF EHL  Right 5/5 5/5 5/5 5/5 5/5  Left 5/5 5/5 5/5 5/5 5/5   Neurological Mental Status:    - Patient is awake, alert, oriented to person, place, month, year, and situation    - Patient is  able to give a clear and coherent history.    - No signs of aphasia or neglect Cranial Nerves    - II: Visual Fields are full. PERRL    - III/IV/VI: EOMI without ptosis or diploplia.     - V: Facial sensation is grossly normal    - VII: Facial movement is symmetric.     - VIII: hearing is intact to voice    - X: Uvula elevates symmetrically    - XI: Shoulder shrug is symmetric.    - XII: tongue is midline without atrophy or fasciculations.  Sensory: Sensation grossly intact to LT Deep Tendon Reflexes    - 2+ and symmetric in the biceps and patellae.   Results  - Imaging/Labs   Results for orders placed or performed during the hospital encounter of 02/03/19 (from the past 48 hour(s))  CBC with Differential     Status: Abnormal   Collection Time: 02/03/19  5:08 AM  Result Value Ref Range   WBC 7.0 4.0 - 10.5 K/uL   RBC 4.83 4.22 - 5.81 MIL/uL   Hemoglobin 16.1 13.0 - 17.0 g/dL   HCT 47.2 39.0 - 52.0 %   MCV 97.7 80.0 - 100.0 fL   MCH 33.3 26.0 - 34.0 pg   MCHC 34.1 30.0 - 36.0 g/dL   RDW 12.6 11.5 - 15.5 %   Platelets 131 (L) 150 - 400 K/uL   nRBC 0.0 0.0 - 0.2 %   Neutrophils Relative % 83 %   Neutro Abs 5.8 1.7 - 7.7 K/uL   Lymphocytes Relative 7 %   Lymphs Abs 0.5 (L) 0.7 - 4.0 K/uL   Monocytes Relative 8 %   Monocytes Absolute 0.5 0.1 - 1.0 K/uL   Eosinophils Relative 0 %   Eosinophils Absolute 0.0 0.0 - 0.5 K/uL   Basophils Relative 1 %   Basophils Absolute 0.1 0.0 - 0.1 K/uL   Immature Granulocytes 1 %   Abs Immature Granulocytes 0.04 0.00 - 0.07 K/uL    Comment: Performed at [facility], [place] [address], [place], [place] [address]  Ethanol     Status: None   Collection Time: 02/03/19  5:08 AM  Result Value Ref Range   Alcohol, Ethyl (B) <10 <10 mg/dL    Comment: (NOTE) Lowest detectable limit for serum alcohol is 10 mg/dL. For medical purposes only. Performed at [facility], [place] [address] [place], [place] [address]   Comprehensive metabolic panel     Status: Abnormal   Collection Time: 02/03/19  5:08 AM  Result Value Ref Range   Sodium 133 (L) 135 - 145 mmol/L   Potassium 4.3 3.5 - 5.1 mmol/L   Chloride 96 (L) 98 - 111 mmol/L   CO2 27 22 - 32 mmol/L   Glucose, Bld 110 (H) 70 - 99 mg/dL   BUN 8 6 - 20 mg/dL   Creatinine, Ser 0.70 0.61 - 1.24 mg/dL   Calcium 9.2 8.9 - 10.3 mg/dL   Total Protein 7.9 6.5 - 8.1 g/dL   Albumin 4.5 3.5 - 5.0 g/dL   AST 114 (H) 15 - 41 U/L   ALT 72 (H) 0 - 44 U/L   Alkaline Phosphatase 58 38 - 126 U/L   Total Bilirubin 1.6 (H) 0.3 - 1.2 mg/dL    GFR calc non Af Amer >60 >60 mL/min   GFR calc Af Amer >60 >60 mL/min   Anion gap 10 5 - 15    Comment:  Performed at [facility], [place] [address], [place], [place] [address]  POC SARS Coronavirus 2 Ag-ED - Nasal Swab (BD Veritor Kit)     Status: None   Collection Time: 02/03/19  7:39 AM  Result Value Ref Range   SARS Coronavirus 2 Ag NEGATIVE NEGATIVE    Comment: (NOTE) SARS-CoV-2 antigen NOT DETECTED.  Negative results are presumptive.  Negative results do not preclude SARS-CoV-2 infection and should not be used as the sole basis for treatment or other patient management decisions, including infection  control decisions, particularly in the presence of clinical signs and  symptoms consistent with COVID-19, or in those who have been in contact with the virus.  Negative results must be combined with clinical observations, patient history, and epidemiological information. The expected result is Negative. Fact Sheet for Patients: PodPark.tn Fact Sheet for Healthcare Providers: GiftContent.is This test is not yet approved or cleared by the [place] FDA and  has been authorized for detection and/or diagnosis of SARS-CoV-2 by FDA under an Emergency Use Authorization (EUA).  This EUA will remain in effect (meaning this test can be used) for the duration of  the COVID-19 de claration under Section 564(b)(1) of the Act, 21 U.S.C. section 360bbb-3(b)(1), unless the authorization is terminated or revoked sooner.   Respiratory Panel by RT PCR (Flu A&B, Covid) - Nasopharyngeal Swab     Status: None   Collection Time: 02/03/19 10:33 AM   Specimen: Nasopharyngeal Swab  Result Value Ref Range   SARS Coronavirus 2 by RT PCR NEGATIVE NEGATIVE    Comment: (NOTE) SARS-CoV-2 target nucleic acids are NOT DETECTED. The SARS-CoV-2 RNA is generally detectable in upper respiratoy specimens during the acute phase of  infection. The lowest concentration of SARS-CoV-2 viral copies this assay can detect is 131 copies/mL. A negative result does not preclude SARS-Cov-2 infection and should not be used as the sole basis for treatment or other patient management decisions. A negative result may occur with  improper specimen collection/handling, submission of specimen other than nasopharyngeal swab, presence of viral mutation(s) within the areas targeted by this assay, and inadequate number of viral copies (<131 copies/mL). A negative result must be combined with clinical observations, patient history, and epidemiological information. The expected result is Negative. Fact Sheet for Patients:  PinkCheek.be Fact Sheet for Healthcare Providers:  GravelBags.it This test is not yet ap proved or cleared by the [place] FDA and  has been authorized for detection and/or diagnosis of SARS-CoV-2 by FDA under an Emergency Use Authorization (EUA). This EUA will remain  in effect (meaning this test can be used) for the duration of the COVID-19 declaration under Section 564(b)(1) of the Act, 21 U.S.C. section 360bbb-3(b)(1), unless the authorization is terminated or revoked sooner.    Influenza A by PCR NEGATIVE NEGATIVE   Influenza B by PCR NEGATIVE NEGATIVE    Comment: (NOTE) The Xpert Xpress SARS-CoV-2/FLU/RSV assay is intended as an aid in  the diagnosis of influenza from Nasopharyngeal swab specimens and  should not be used as a sole basis for treatment. Nasal washings and  aspirates are unacceptable for Xpert Xpress SARS-CoV-2/FLU/RSV  testing. Fact Sheet for Patients: PinkCheek.be Fact Sheet for Healthcare Providers: GravelBags.it This test is not yet approved or cleared by the [place] FDA and  has been authorized for detection and/or diagnosis of SARS-CoV-2 by  FDA under an Emergency  Use Authorization (EUA). This EUA will remain  in effect (meaning this test can be used) for the  duration of the  Covid-19 declaration under Section 564(b)(1) of the Act, 21  U.S.C. section 360bbb-3(b)(1), unless the authorization is  terminated or revoked. Performed at [facility], [place] [address] [place], [place] [address]     DG Lumbar Spine Complete  Result Date: 02/03/2019 CLINICAL DATA:  Seizure with fall and low back pain. EXAM: LUMBAR SPINE - COMPLETE 4+ VIEW COMPARISON:  Chest CT 02/03/2019 FINDINGS: Anterior wedge compression fracture involving the T12 vertebral body with approximately 50% loss of height. There is also a compression fracture along the superior endplate of H06 with mild height loss. Degenerative endplate changes at C3-J6. Probable large Schmorl's node along the inferior endplate of L3 but difficult to exclude a compression fracture involving the L3 vertebral body. Alignment of lumbar spine is normal. Mild disc space narrowing at L2-L3. No evidence for a pars defect. IMPRESSION: 1. Compression fracture involving the T12 vertebral body with at least 50% loss of height. 2. Compression fracture involving the superior endplate of E83 with mild vertebral body height loss. 3. Cannot exclude compression fractures at L3. Electronically Signed   By: Markus Daft M.D.   On: 02/03/2019 13:42   CT Angio Chest PE W and/or Wo Contrast  Result Date: 02/03/2019 CLINICAL DATA:  Hypoxemia. Additional history provided: Patient fell at home, history of severe ETOH abuse, right back pain. EXAM: CT ANGIOGRAPHY CHEST WITH CONTRAST TECHNIQUE: Multidetector CT imaging of the chest was performed using the standard protocol during bolus administration of intravenous contrast. Multiplanar CT image reconstructions and MIPs were obtained to evaluate the vascular anatomy. CONTRAST:  162m OMNIPAQUE IOHEXOL 350 MG/ML SOLN COMPARISON:  Chest radiograph 02/03/2019 FINDINGS: CARDIOVASCULAR:  Normal heart size.No pericardial effusion.No thoracic aortic aneurysm. Satisfactory opacification of the pulmonary arteries to the segmental level. Evaluation for pulmonary embolus is slightly limited due to respiratory motion artifact, particularly at the level of the lung bases. No pulmonary artery filling defect is identified to suggest pulmonary embolus. MEDIASTINUM/NODES: No enlarged mediastinal, hilar or axillary lymph nodes. The visualized thyroid gland is unremarkable. LUNGS/PLEURA: Mild opacity within the dependent aspect of the right upper lobe and right lower lobe, as well as left lower lobe, with an appearance most consistent with incomplete atelectasis. No pleural effusion or pneumothorax. The central airways are grossly patent. UPPER ABDOMEN: Severe hepatic steatosis. MUSCULOSKELETAL: No acute bony abnormality. Review of the MIP images confirms the above findings. IMPRESSION: Evaluation for pulmonary embolus is slightly limited by respiratory motion artifact at the level of the lung bases. No pulmonary embolus is identified. Mild pulmonary opacities within the dependent right upper and lower lobes, as well as left lower lobe, with an appearance most consistent with incomplete atelectasis. Pneumonia is difficult to definitively exclude. Severe hepatic steatosis. Electronically Signed   By: KKellie SimmeringDO   On: 02/03/2019 07:32   DG Chest Portable 1 View  Result Date: 02/03/2019 CLINICAL DATA:  Chest pain EXAM: PORTABLE CHEST 1 VIEW COMPARISON:  None. FINDINGS: Shallow lung inflation with right basilar atelectasis. Cardiomediastinal contours are normal. No pleural effusion or pneumothorax. IMPRESSION: Right basilar atelectasis. Electronically Signed   By: KUlyses JarredM.D.   On: 02/03/2019 02:00   Impression/Plan   34y.o. male with T11, T12 compression fracture after falling due to ?seizure. He is neurologically intact.   Xrays and CT scan of lumbar spine were reviewed.  T11 fracture, 20%  height loss, no retropulsion. T12 fracture, 50% height loss, 729mretropulsion resulting in moderate canal stenosis. Also subsequent mild  developing kyphosis. No indication for emergent NS intervention. Patient is eager for discharge home. Assuming he is able to ambulate without significant pain, he can f/u on an outpatient basis for monitoring in 1 week. He is to wear his TLSO when upright and OOB. This was communicated with patient. Red flag symptoms discussed. Will follow if patient is admitted for pain control. We would prefer transfer to [facility] should admission be necessary.  Please call for any concerns.  Ferne Reus, PA-C [facility] [employer]

## 2019-02-03 NOTE — ED Triage Notes (Signed)
Pt is from home reported to have fallen in [place] while having a  Seizure Hx of severe ETOH abuse and was believed to be going threw withdrawls. Pt c/o mid right back pain r/t fall was ambulatory at scene but now reports unable to ambulate. Given 500 ml NSS IV and 100 mcg fentanyl IV

## 2019-03-05 ENCOUNTER — Ambulatory Visit: Payer: 59 | Admitting: Neurology

## 2019-04-03 ENCOUNTER — Encounter: Payer: Self-pay | Admitting: Neurology

## 2019-04-03 ENCOUNTER — Other Ambulatory Visit: Payer: Self-pay

## 2019-04-03 ENCOUNTER — Ambulatory Visit: Payer: 59 | Admitting: Neurology

## 2019-04-03 DIAGNOSIS — S22080S Wedge compression fracture of T11-T12 vertebra, sequela: Secondary | ICD-10-CM

## 2019-04-03 DIAGNOSIS — G40909 Epilepsy, unspecified, not intractable, without status epilepticus: Secondary | ICD-10-CM

## 2019-04-03 DIAGNOSIS — S22080A Wedge compression fracture of T11-T12 vertebra, initial encounter for closed fracture: Secondary | ICD-10-CM

## 2019-04-03 HISTORY — DX: Epilepsy, unspecified, not intractable, without status epilepticus: G40.909

## 2019-04-03 HISTORY — DX: Wedge compression fracture of T11-T12 vertebra, initial encounter for closed fracture: S22.080A

## 2019-04-03 MED ORDER — ALPRAZOLAM 0.5 MG PO TABS: ORAL_TABLET | ORAL | 0 refills | Status: AC

## 2019-04-03 MED ORDER — ZONISAMIDE 50 MG PO CAPS: ORAL_CAPSULE | ORAL | 3 refills | Status: AC
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
## 2019-04-03 NOTE — Patient Instructions (Signed)
   We will start Zonegran for the seizures. 

## 2019-04-06 ENCOUNTER — Telehealth: Payer: Self-pay | Admitting: Neurology

## 2019-04-06 NOTE — Telephone Encounter (Signed)
[employer] order sent to GI. They will obtain the auth and reach out to the patient to schedule.  

## 2019-04-13 ENCOUNTER — Telehealth: Payer: Self-pay | Admitting: Neurology

## 2019-04-13 ENCOUNTER — Ambulatory Visit (INDEPENDENT_AMBULATORY_CARE_PROVIDER_SITE_OTHER): Payer: 59

## 2019-04-13 ENCOUNTER — Other Ambulatory Visit: Payer: Self-pay

## 2019-04-13 DIAGNOSIS — G40909 Epilepsy, unspecified, not intractable, without status epilepticus: Secondary | ICD-10-CM

## 2019-04-13 NOTE — Procedures (Signed)
    History:  Connor Lopez is a 34 year old gentleman with a history of 2 generalized seizures lifetime.  The first event was on 23 August 2017.  The second event occurred on 03 February 2019.  The patient had gotten up to go to the bathroom and had a witnessed generalized seizure, he sustained a T12 compression fracture with this.  He is being evaluated for the seizures.  This is a routine EEG.  No skull defects are noted.  Medications include diazepam, lisinopril, oxycodone, and Zanaflex.  EEG classification: Normal awake  Description of the recording: The background rhythms of this recording consists of a fairly well modulated medium amplitude alpha rhythm of 11 Hz that is reactive to eye opening and closure. As the record progresses, the patient appears to remain in the waking state throughout the recording. Photic stimulation was performed, resulting in a bilateral and symmetric photic driving response. Hyperventilation was also performed, resulting in a minimal buildup of the background rhythm activities without significant slowing seen. At no time during the recording does there appear to be evidence of spike or spike wave discharges or evidence of focal slowing. EKG monitor shows no evidence of cardiac rhythm abnormalities with a heart rate of 72.  Impression: This is a normal EEG recording in the waking state. No evidence of ictal or interictal discharges are seen.

## 2019-04-13 NOTE — Telephone Encounter (Signed)
I called the patient.  The EEG study was normal.  MRI of the brain is pending. 

## 2019-06-01 ENCOUNTER — Other Ambulatory Visit: Payer: Self-pay | Admitting: Nephrology

## 2019-06-01 DIAGNOSIS — R809 Proteinuria, unspecified: Secondary | ICD-10-CM

## 2019-06-01 DIAGNOSIS — N1831 Chronic kidney disease, stage 3a: Secondary | ICD-10-CM

## 2019-06-01 DIAGNOSIS — N179 Acute kidney failure, unspecified: Secondary | ICD-10-CM

## 2019-06-01 DIAGNOSIS — Q631 Lobulated, fused and horseshoe kidney: Secondary | ICD-10-CM

## 2019-06-01 DIAGNOSIS — I129 Hypertensive chronic kidney disease with stage 1 through stage 4 chronic kidney disease, or unspecified chronic kidney disease: Secondary | ICD-10-CM

## 2019-07-07 ENCOUNTER — Ambulatory Visit: Payer: 59 | Admitting: Adult Health

## 2019-07-07 ENCOUNTER — Encounter: Payer: Self-pay | Admitting: Adult Health

## 2019-11-26 ENCOUNTER — Ambulatory Visit: Payer: 59 | Admitting: Family Medicine

## 2019-11-30 ENCOUNTER — Other Ambulatory Visit: Payer: Self-pay

## 2019-11-30 ENCOUNTER — Encounter (HOSPITAL_COMMUNITY): Payer: Self-pay

## 2019-11-30 ENCOUNTER — Encounter: Payer: Self-pay | Admitting: Family Medicine

## 2019-11-30 ENCOUNTER — Emergency Department (HOSPITAL_COMMUNITY)
Admission: EM | Admit: 2019-11-30 | Discharge: 2019-11-30 | Disposition: A | Discharge status: Home / Self Care | Payer: 59 | Attending: Emergency Medicine | Admitting: Emergency Medicine

## 2019-11-30 DIAGNOSIS — R053 Chronic cough: Secondary | ICD-10-CM | POA: Diagnosis not present

## 2019-11-30 DIAGNOSIS — I10 Essential (primary) hypertension: Secondary | ICD-10-CM | POA: Insufficient documentation

## 2019-11-30 DIAGNOSIS — R7401 Elevation of levels of liver transaminase levels: Secondary | ICD-10-CM | POA: Insufficient documentation

## 2019-11-30 DIAGNOSIS — Z79899 Other long term (current) drug therapy: Secondary | ICD-10-CM | POA: Insufficient documentation

## 2019-11-30 DIAGNOSIS — R112 Nausea with vomiting, unspecified: Secondary | ICD-10-CM | POA: Insufficient documentation

## 2019-11-30 DIAGNOSIS — R7989 Other specified abnormal findings of blood chemistry: Secondary | ICD-10-CM

## 2019-11-30 LAB — COMPREHENSIVE METABOLIC PANEL
ALT: 89 U/L — ABNORMAL HIGH (ref 0–44)
AST: 137 U/L — ABNORMAL HIGH (ref 15–41)
Albumin: 4.8 g/dL (ref 3.5–5.0)
Alkaline Phosphatase: 60 U/L (ref 38–126)
Anion gap: 16 — ABNORMAL HIGH (ref 5–15)
BUN: 24 mg/dL — ABNORMAL HIGH (ref 6–20)
CO2: 23 mmol/L (ref 22–32)
Calcium: 8.9 mg/dL (ref 8.9–10.3)
Chloride: 94 mmol/L — ABNORMAL LOW (ref 98–111)
Creatinine, Ser: 2.18 mg/dL — ABNORMAL HIGH (ref 0.61–1.24)
GFR, Estimated: 39 mL/min — ABNORMAL LOW (ref >60)
Glucose, Bld: 94 mg/dL (ref 70–99)
Potassium: 5.1 mmol/L (ref 3.5–5.1)
Sodium: 133 mmol/L — ABNORMAL LOW (ref 135–145)
Total Bilirubin: 2.3 mg/dL — ABNORMAL HIGH (ref 0.3–1.2)
Total Protein: 8.9 g/dL — ABNORMAL HIGH (ref 6.5–8.1)

## 2019-11-30 LAB — CBC WITH DIFFERENTIAL/PLATELET
Abs Immature Granulocytes: 0 10*3/uL (ref 0.00–0.07)
Basophils Absolute: 0.1 10*3/uL (ref 0.0–0.1)
Basophils Relative: 2 %
Eosinophils Absolute: 0 10*3/uL (ref 0.0–0.5)
Eosinophils Relative: 1 %
HCT: 45.2 % (ref 39.0–52.0)
Hemoglobin: 15.3 g/dL (ref 13.0–17.0)
Immature Granulocytes: 0 %
Lymphocytes Relative: 17 %
Lymphs Abs: 0.8 10*3/uL (ref 0.7–4.0)
MCH: 33.7 pg (ref 26.0–34.0)
MCHC: 33.8 g/dL (ref 30.0–36.0)
MCV: 99.6 fL (ref 80.0–100.0)
Monocytes Absolute: 0.7 10*3/uL (ref 0.1–1.0)
Monocytes Relative: 14 %
Neutro Abs: 3.3 10*3/uL (ref 1.7–7.7)
Neutrophils Relative %: 66 %
Platelets: 149 10*3/uL — ABNORMAL LOW (ref 150–400)
RBC: 4.54 MIL/uL (ref 4.22–5.81)
RDW: 13 % (ref 11.5–15.5)
WBC: 5 10*3/uL (ref 4.0–10.5)
nRBC: 0 % (ref 0.0–0.2)

## 2019-11-30 LAB — LIPASE, BLOOD: Lipase: 131 U/L — ABNORMAL HIGH (ref 11–51)

## 2019-11-30 MED ORDER — ONDANSETRON 4 MG PO TBDP: ORAL_TABLET | ORAL | 0 refills | Status: AC

## 2019-11-30 MED ORDER — LORAZEPAM 1 MG PO TABS: 1.0000 mg | ORAL_TABLET | Freq: Once | ORAL | Status: DC

## 2019-11-30 MED ORDER — LORAZEPAM 2 MG/ML IJ SOLN
1.0000 mg | Freq: Once | INTRAMUSCULAR | Status: AC
  Administered 2019-11-30: 1 mg via INTRAVENOUS
  Filled 2019-11-30: qty 1

## 2019-11-30 MED ORDER — ALUM & MAG HYDROXIDE-SIMETH 200-200-20 MG/5ML PO SUSP
30.0000 mL | Freq: Once | ORAL | Status: AC
  Administered 2019-11-30: 30 mL via ORAL
  Filled 2019-11-30: qty 30

## 2019-11-30 MED ORDER — ONDANSETRON HCL 4 MG/2ML IJ SOLN
4.0000 mg | Freq: Once | INTRAMUSCULAR | Status: AC
  Administered 2019-11-30: 4 mg via INTRAVENOUS
  Filled 2019-11-30: qty 2

## 2019-11-30 MED ORDER — SODIUM CHLORIDE 0.9 % IV BOLUS
1000.0000 mL | Freq: Once | INTRAVENOUS | Status: AC
  Administered 2019-11-30: 1000 mL via INTRAVENOUS

## 2019-11-30 NOTE — Discharge Instructions (Signed)
Your liver function tests were mildly elevated.  This is been seen previously on your blood work.  Typically this is something that would be managed by the family doctor or a gastroenterologist.  If you are on Protonix twice a day then typically a gastroenterologist would be involved in your care.  If you do not have one then I have provided the doctor on call for you to try and see in the office.   Try to avoid things that may make this worse, most commonly these are spicy foods tomato based products fatty foods chocolate and peppermint.  Alcohol and tobacco can also make this worse.  Return to the emergency department for sudden worsening pain fever or inability to eat or drink.

## 2019-11-30 NOTE — ED Triage Notes (Signed)
Pt presents to ED with c/o nausea/vomiting starting 0200. 2-3 episodes of clear liquid emesis. Self-tested for covid at work, negative result. Loss of appetite for 2 days with reduced intake. Denies diarrhea. Took zofran but threw it up. Takes hydrocodone for back, last taken yesterday morning.

## 2019-11-30 NOTE — ED Notes (Signed)
Pt tolerated additional cheese and crackers. Reported improvement in nausea and stated he felt well enough to go home.

## 2019-11-30 NOTE — ED Provider Notes (Signed)
[facility]-EMERGENCY DEPT Provider Note   CSN: [id number] Arrival date & time: 11/30/19  0865     History Chief Complaint  Patient presents with  . Nausea    Story Connor Lopez is a 34 y.o. male.  34 yo M with a chief complaints of nausea and vomiting.  Going on for about 12 hours now.  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  Denies suspicious food intake denies abdominal pain denies fever denies diarrhea.  The history is provided by the patient.  Illness Severity:  Moderate Onset quality:  Gradual Duration:  12 hours Timing:  Constant Progression:  Unchanged Chronicity:  New Associated symptoms: cough (chronic and unchanged), nausea and vomiting   Associated symptoms: no abdominal pain, no chest pain, no congestion, no diarrhea, no fever, no headaches, no myalgias, no rash and no shortness of breath        Past Medical History:  Diagnosis Date  . Anxiety   . GERD (gastroesophageal reflux disease)   . HTN (hypertension)   . Seizure disorder (HCC) 04/03/2019  . T12 compression fracture (HCC) 04/03/2019    Patient Active Problem List   Diagnosis Date Noted  . Seizure disorder (HCC) 04/03/2019  . T12 compression fracture (HCC) 04/03/2019    Past Surgical History:  Procedure Laterality Date  . ABDOMINAL SURGERY    . head surgery         Family History  Family history unknown: Yes    Social History   Tobacco Use  . Smoking status: Never Smoker  . Smokeless tobacco: Never Used  Vaping Use  . Vaping Use: Never used  Substance Use Topics  . Alcohol use: Yes    Comment: daily one  . Drug use: Never    Home Medications Prior to Admission medications   Medication Sig Start Date End Date Taking? Authorizing Provider  ALPRAZolam Prudy Feeler) 0.5 MG tablet Take 2 tablets approximately 45 minutes prior to the MRI study, take a third tablet if needed. 04/03/19   York Spaniel, MD  diazepam  (VALIUM) 5 MG tablet Take 5 mg by mouth at bedtime. 02/26/19   [provider]  lisinopril (ZESTRIL) 10 MG tablet Take 10 mg by mouth daily. 08/13/18   [provider]  ondansetron (ZOFRAN ODT) 4 MG disintegrating tablet 4mg  ODT q4 hours prn nausea/vomit 11/30/19   12/02/19, DO  oxyCODONE-acetaminophen (PERCOCET) 7.5-325 MG tablet Take 1 tablet by mouth every 8 (eight) hours as needed. 02/23/19   [provider]  tizanidine (ZANAFLEX) 2 MG capsule SMARTSIG:1-2 Capsule(s) By Mouth Every 6 Hours 02/23/19   [provider]  zonisamide (ZONEGRAN) 50 MG capsule One capsule twice a day for 2 weeks, then take 2 capsules twice a day 04/03/19   04/05/19, MD    Allergies    Bee venom and Klonopin [clonazepam]  Review of Systems   Review of Systems  Constitutional: Negative for chills and fever.  HENT: Negative for congestion and facial swelling.   Eyes: Negative for discharge and visual disturbance.  Respiratory: Positive for cough (chronic and unchanged). Negative for shortness of breath.   Cardiovascular: Negative for chest pain and palpitations.  Gastrointestinal: Positive for nausea and vomiting. Negative for abdominal pain and diarrhea.  Musculoskeletal: Negative for arthralgias and myalgias.  Skin: Negative for color change and rash.  Neurological: Negative for tremors, syncope and headaches.  Psychiatric/Behavioral: Negative for confusion and dysphoric mood.  Physical Exam Updated Vital Signs BP (!) 132/96   Pulse 89   Temp 97.8 F (36.6 C) (Oral)   Resp 20   Ht 6\' 2"  (1.88 m)   Wt 108.9 kg   SpO2 93%   BMI 30.81 kg/m   Physical Exam Vitals and nursing note reviewed.  Constitutional:      Appearance: He is well-developed.  HENT:     Head: Normocephalic and atraumatic.  Eyes:     Pupils: Pupils are equal, round, and reactive to light.  Neck:     Vascular: No JVD.  Cardiovascular:     Rate and Rhythm: Normal rate and regular  rhythm.     Heart sounds: No murmur heard.  No friction rub. No gallop.   Pulmonary:     Effort: No respiratory distress.     Breath sounds: No wheezing.  Abdominal:     General: There is no distension.     Tenderness: There is no abdominal tenderness. There is no guarding or rebound.  Musculoskeletal:        General: Normal range of motion.     Cervical back: Normal range of motion and neck supple.  Skin:    Coloration: Skin is not pale.     Findings: No rash.  Neurological:     Mental Status: He is alert and oriented to person, place, and time.  Psychiatric:        Behavior: Behavior normal.     ED Results / Procedures / Treatments   Labs (all labs ordered are listed, but only abnormal results are displayed) Labs Reviewed  COMPREHENSIVE METABOLIC PANEL - Abnormal; Notable for the following components:      Result Value   Sodium 133 (*)    Chloride 94 (*)    BUN 24 (*)    Creatinine, Ser 2.18 (*)    Total Protein 8.9 (*)    AST 137 (*)    ALT 89 (*)    Total Bilirubin 2.3 (*)    GFR, Estimated 39 (*)    Anion gap 16 (*)    All other components within normal limits  LIPASE, BLOOD - Abnormal; Notable for the following components:   Lipase 131 (*)    All other components within normal limits  CBC WITH DIFFERENTIAL/PLATELET - Abnormal; Notable for the following components:   Platelets 149 (*)    All other components within normal limits    EKG None  Radiology No results found.  Procedures Procedures (including critical care time)  Medications Ordered in ED Medications  sodium chloride 0.9 % bolus 1,000 mL (1,000 mLs Intravenous New Bag/Given 11/30/19 0933)  ondansetron (ZOFRAN) injection 4 mg (4 mg Intravenous Given 11/30/19 0933)  alum & mag hydroxide-simeth (MAALOX/MYLANTA) 200-200-20 MG/5ML suspension 30 mL (30 mLs Oral Given 11/30/19 1013)  LORazepam (ATIVAN) injection 1 mg (1 mg Intravenous Given 11/30/19 0933)    ED Course  I have reviewed the  triage vital signs and the nursing notes.  Pertinent labs & imaging results that were available during my care of the patient were reviewed by me and considered in my medical decision making (see chart for details).    MDM Rules/Calculators/A&P                          34 yo M with a cc of n/v.  Going on for 12 hours.  Well appearing non toxic.  Will obtain labs, give fluids antiemetics.  Patient has mild LFT elevation.  Appears to be at his baseline.  Lipase is also mildly elevated though not quite at the diagnostic level for pancreatitis.  On repeat exam patient is feeling better and tolerating p.o.  Again denies any abdominal pain.  With him having nausea and vomiting and being around multiple other people at work with the same illness suspect that is likely the cause.  We will have him follow-up with his gastroenterologist.    10:38 AM:  I have discussed the diagnosis/risks/treatment options with the patient and believe the pt to be eligible for discharge home to follow-up with PCP, GI. We also discussed returning to the ED immediately if new or worsening sx occur. We discussed the sx which are most concerning (e.g., sudden worsening pain, fever, inability to tolerate by mouth) that necessitate immediate return. Medications administered to the patient during their visit and any new prescriptions provided to the patient are listed below.  Medications given during this visit Medications  sodium chloride 0.9 % bolus 1,000 mL (1,000 mLs Intravenous New Bag/Given 11/30/19 0933)  ondansetron (ZOFRAN) injection 4 mg (4 mg Intravenous Given 11/30/19 0933)  alum & mag hydroxide-simeth (MAALOX/MYLANTA) 200-200-20 MG/5ML suspension 30 mL (30 mLs Oral Given 11/30/19 1013)  LORazepam (ATIVAN) injection 1 mg (1 mg Intravenous Given 11/30/19 0933)     The patient appears reasonably screen and/or stabilized for discharge and I doubt any other medical condition or other [facility] requiring further screening,  evaluation, or treatment in the ED at this time prior to discharge.  Final Clinical Impression(s) / ED Diagnoses Final diagnoses:  Nausea and vomiting in adult  LFT elevation    Rx / DC Orders ED Discharge Orders         Ordered    ondansetron (ZOFRAN ODT) 4 MG disintegrating tablet        11/30/19 1036           Melene Plan, DO 11/30/19 1038

## 2019-11-30 NOTE — ED Notes (Signed)
Coworker who provided ride reports pt uses ETOH.

## 2019-11-30 NOTE — ED Notes (Signed)
Pt tolerated drinking water and ate a cracker. He stated he "felt queezy" after eating the cracker.

## 2019-12-01 ENCOUNTER — Ambulatory Visit (HOSPITAL_COMMUNITY)
Admission: RE | Admit: 2019-12-01 | Discharge: 2019-12-01 | Disposition: A | Discharge status: Home / Self Care | Payer: 59 | Attending: Psychiatry | Admitting: Psychiatry

## 2019-12-01 DIAGNOSIS — F10220 Alcohol dependence with intoxication, uncomplicated: Secondary | ICD-10-CM | POA: Diagnosis not present

## 2019-12-01 NOTE — H&P (Signed)
Behavioral Health Medical Screening Exam  Connor Lopez is an 34 y.o. male.  Patient presents to [facility] as a walk-in voluntarily holding alcohol dependence.  He reported using vodka prior to coming to the facility today.  He states that he has been trying to cut down but is been having some withdrawal symptoms.  He states that in the past he has attempted to do this on his own and has had a seizure.  He presents with a friend from work who is also a Engineer, civil (consulting).  He states he is hoping to get a month supply of Valium so that he can taper off of alcohol at home without withdrawal symptoms.  He denies having any suicidal or homicidal ideations and denies any hallucinations.  He denies having any mental health history.  He is informed that he needs to be admitted to a detox bed and is informed of [facility] in [place] and that they have a walk-in service.  Patient states he is unsure if he wants to be admitted anywhere and was hoping to be able to go through detox at home.  He is informed that I cannot prescribe him benzodiazepines and a walk-in service.  He continues to deny any suicidal homicidal ideations and denies any hallucinations.  Friend that is with him does not have any safety concerns as far suicidal or homicidal ideations.  He is provided with information for detox in [place].  He states that he plans to go there after leaving here.  Patient does not meet inpatient psychiatric treatment criteria.  Total Time spent with patient: 30 minutes  Psychiatric Specialty Exam: Physical Exam Vitals and nursing note reviewed.  Constitutional:      Appearance: He is well-developed.  HENT:     Head: Normocephalic.  Eyes:     Pupils: Pupils are equal, round, and reactive to light.  Cardiovascular:     Rate and Rhythm: Normal rate.  Pulmonary:     Effort: Pulmonary effort is normal.  Musculoskeletal:        General: Normal range of motion.  Neurological:     Mental Status: He is alert and oriented to  person, place, and time.    Review of Systems  Constitutional: Negative.   HENT: Negative.   Eyes: Negative.   Respiratory: Negative.   Cardiovascular: Negative.   Gastrointestinal: Negative.   Genitourinary: Negative.   Musculoskeletal: Negative.   Skin: Negative.   Neurological: Negative.   Psychiatric/Behavioral: The patient is nervous/anxious.    There were no vitals taken for this visit.There is no height or weight on file to calculate BMI. General Appearance: Disheveled Eye Contact:  Good Speech:  Clear and Coherent and Normal Rate Volume:  Normal Mood:  Anxious Affect:  Appropriate and Congruent Thought Process:  Coherent and Descriptions of Associations: Intact Orientation:  Full (Time, Place, and Person) Thought Content:  WDL Suicidal Thoughts:  No Homicidal Thoughts:  No Memory:  Immediate;   Good Recent;   Good Remote;   Good Judgement:  Good Insight:  Fair Psychomotor Activity:  Normal Concentration: Concentration: Good and Attention Span: Good Recall:  Good Fund of Knowledge:Fair Language: Good Akathisia:  No Handed:  Right AIMS (if indicated):    Assets:  Communication Skills Desire for Improvement Financial Resources/Insurance Housing Physical Health Social Support Transportation Sleep:     Musculoskeletal: Strength & Muscle Tone: within normal limits Gait & Station: normal Patient leans: N/A  There were no vitals taken for this visit.  Recommendations: Based  on my evaluation the patient does not appear to have an emergency medical condition.  Gerlene Burdock Issaac Shipper, FNP 12/01/2019, 10:42 PM

## 2019-12-01 NOTE — BH Assessment (Addendum)
Assessment Note  Connor Lopez is an 34 y.o. male presenting voluntarily to [facility] stating "I need a 1 or 2 months prescription of Valium, I lost my PCP appointment slip. Patient requsting to detox from alcohol at home with the help of his friend that's a nurse. Patient admitted to using Vodka prior to coming to the facility today. Patient reported he started using alcohol in his 20's. Patient reported drinking approxiatmatly 1 pint of alcohol daily. Patient reported being prescribed hydrocodone due to fractured back in 01/2019. Patient reported taking Xanax for the past 3 years, he didn't like then he was started on Valium. Patient denied SI, HI and psychosis. Patient denied prior psych inpatient treatment, suicide attempts and self-harming behaviors. Patient denies any mental health history. Patient reported normal sleep and poor appetite.   Patient reported he is not receiving any substance abuse or mental health services anywhere. Patient reported not wanting to be admitted anywhere and requesting to detox from home and that his friend is a Engineer, civil (consulting) and could take care of him. Patient reported history of having seizures when trying to detox on his own.   Patient reported living together with his girlfriend. Patient reported being employed, no work related stressors. Patient denied access to guns. Patient was cooperative during assessment. Friend in lobby reported not having any safety concerns as far suicidal or homicidal ideations. Patient was provided with information for detox in [place].  He states that he plans to go there after leaving here.  Patient does not meet inpatient psychiatric treatment criteria.  Diagnosis: Alcohol dependence  Past Medical History:  Past Medical History:  Diagnosis Date  . Anxiety   . GERD (gastroesophageal reflux disease)   . HTN (hypertension)   . Seizure disorder (HCC) 04/03/2019  . T12 compression fracture (HCC) 04/03/2019    Past Surgical History:   Procedure Laterality Date  . ABDOMINAL SURGERY    . head surgery      Family History:  Family History  Family history unknown: Yes    Social History:  reports that he has never smoked. He has never used smokeless tobacco. He reports current alcohol use. He reports that he does not use drugs.  Additional Social History:  Alcohol / Drug Use Pain Medications: see MAR Prescriptions: see MAR Over the Counter: see MAR  CIWA:   COWS:    Allergies:  Allergies  Allergen Reactions  . Bee Venom Swelling  . Klonopin [Clonazepam] Nausea Only    Home Medications: (Not in a hospital admission)   OB/GYN Status:  No LMP for male patient.  General Assessment Data Location of Assessment:  [facility]) TTS Assessment: In system Is this a Tele or Face-to-Face Assessment?: Tele Assessment Is this an Initial Assessment or a Re-assessment for this encounter?: Initial Assessment Patient Accompanied by:: N/A Language Other than English: No Living Arrangements:  (family home) What gender do you identify as?: Male Marital status: Single Pregnancy Status: Unknown Living Arrangements: Spouse/significant other Can pt return to current living arrangement?: Yes Admission Status: Voluntary Is patient capable of signing voluntary admission?: Yes Referral Source: Self/Family/Friend  Medical Screening Exam [facility] Walk-in ONLY) Medical Exam completed: Yes  Crisis Care Plan Living Arrangements: Spouse/significant other Legal Guardian:  (self) Name of Psychiatrist:  (none) Name of Therapist:  (none)  Education Status Is patient currently in school?: No Is the patient employed, unemployed or receiving disability?: Unemployed  Risk to self with the past 6 months Suicidal Ideation: No Has patient been a risk to  self within the past 6 months prior to admission? : No Suicidal Intent: No Has patient had any suicidal intent within the past 6 months prior to admission? : No Is patient at risk for  suicide?: No Suicidal Plan?: No Has patient had any suicidal plan within the past 6 months prior to admission? : No Access to Means: No What has been your use of drugs/alcohol within the last 12 months?:  (alcohol 1x pint daily) Previous Attempts/Gestures: No How many times?:  (0) Other Self Harm Risks:  (none) Triggers for Past Attempts:  (n/a) Intentional Self Injurious Behavior: None Family Suicide History: No Recent stressful life event(s):  (alcohol addiction) Persecutory voices/beliefs?: No Depression: No Depression Symptoms:  (n/a) Substance abuse history and/or treatment for substance abuse?: No Suicide prevention information given to non-admitted patients: Not applicable  Risk to Others within the past 6 months Homicidal Ideation: No Does patient have any lifetime risk of violence toward others beyond the six months prior to admission? : No Thoughts of Harm to Others: No Current Homicidal Intent: No Current Homicidal Plan: No Access to Homicidal Means: No History of harm to others?: No Assessment of Violence: None Noted Violent Behavior Description:  (none) Does patient have access to weapons?: No Criminal Charges Pending?: No Does patient have a court date: No Is patient on probation?: No  Psychosis Hallucinations: None noted Delusions: None noted  Mental Status Report Appearance/Hygiene: Unremarkable Eye Contact: Fair Motor Activity: Freedom of movement Speech: Logical/coherent Level of Consciousness: Alert Mood: Anxious Affect: Anxious Anxiety Level: Moderate Thought Processes: Coherent Judgement: Partial Orientation: Person, Place, Time, Situation Obsessive Compulsive Thoughts/Behaviors: None  Cognitive Functioning Concentration: Normal Memory: Recent Intact Is patient IDD: No Insight: Poor Impulse Control: Poor Appetite: Poor Have you had any weight changes? : Loss Amount of the weight change? (lbs):  (unknown) Sleep: No Change Total Hours of  Sleep:  (10) Vegetative Symptoms: None  ADLScreening [facility] Assessment Services) Patient's cognitive ability adequate to safely complete daily activities?: Yes Patient able to express need for assistance with ADLs?: Yes Independently performs ADLs?: Yes (appropriate for developmental age)  ADL Screening (condition at time of admission) Patient's cognitive ability adequate to safely complete daily activities?: Yes Patient able to express need for assistance with ADLs?: Yes Independently performs ADLs?: Yes (appropriate for developmental age)  Disposition:  Reola Calkins, NP, patient does not meet inpatient criteria. Patient given outpatient referral sources, including [facility] in [place], along with several other substance abuse programs. Patient agreed to follow through with outpatient services.    On Site Evaluation by:   Reviewed with Physician:    Burnetta Sabin 12/01/2019 10:23 PM

## 2021-07-07 IMAGING — CT CT L SPINE W/O CM
2 of 3 series · 11 of 33 positions shown, 13 images · non-contrast
Comparison: Chest CT dated 02/02/2019 and lumbar spine radiograph
dated 02/03/2019.

CLINICAL DATA: 33-year-old male with low back pain.

EXAM:
CT LUMBAR SPINE WITHOUT CONTRAST
TECHNIQUE: Multidetector CT imaging of the lumbar spine was performed without
intravenous contrast administration. Multiplanar CT image
reconstructions were also generated.

[Series 4: l spine soft · axial · 0.38mm/px · z∈[-640,-352]mm · 8 of 172 slices shown, 10 images]
[im 14/172  soft-tissue]
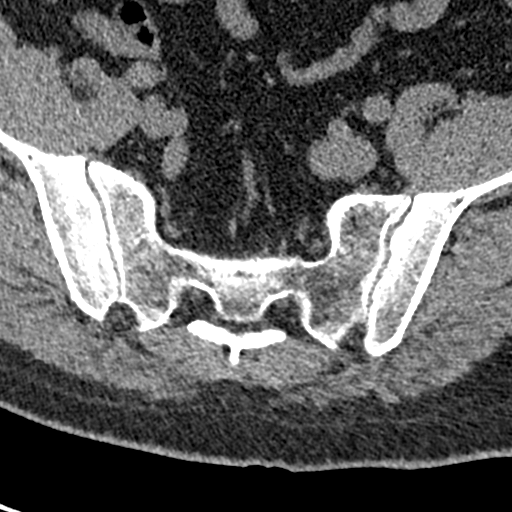
[im 14/172  bone]
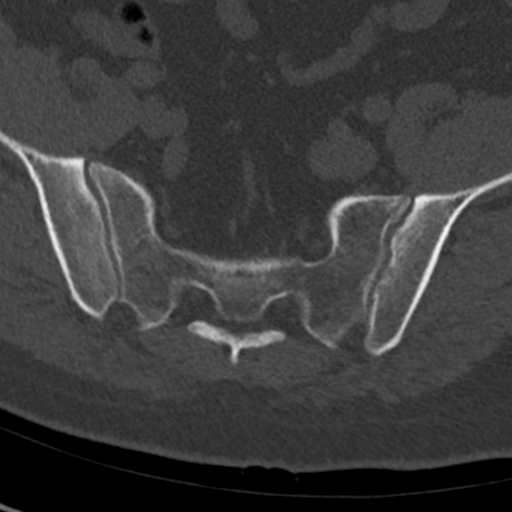
[im 40/172  bone]
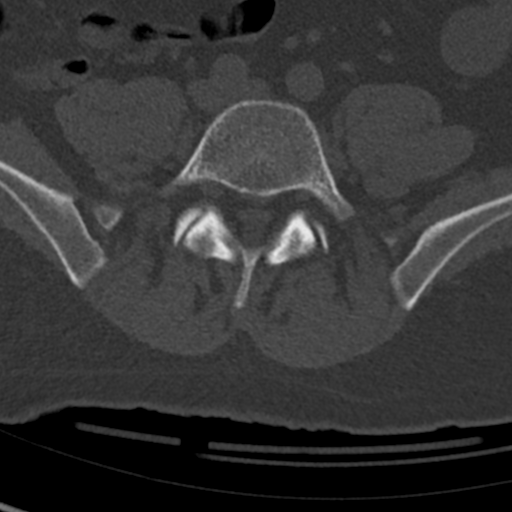
[im 53/172  bone]
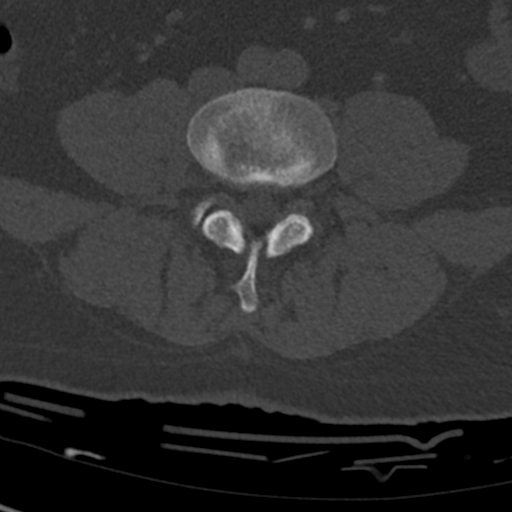
[im 79/172  bone]
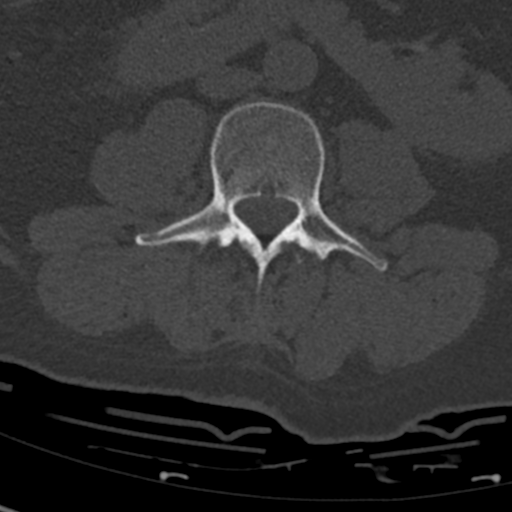
[im 93/172  soft-tissue]
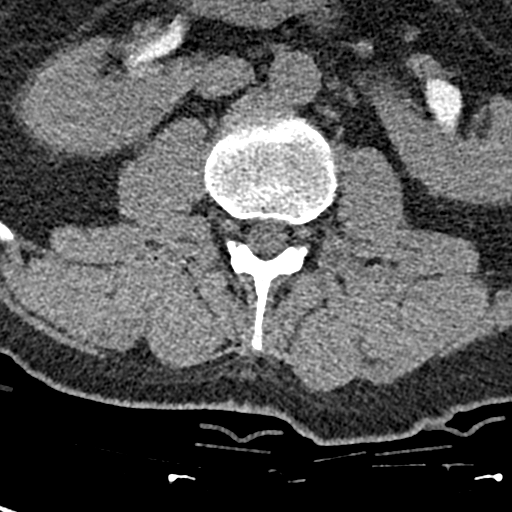
[im 93/172  bone]
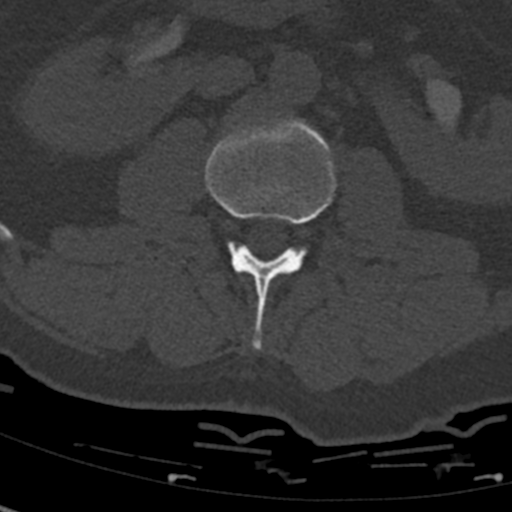
[im 119/172  bone]
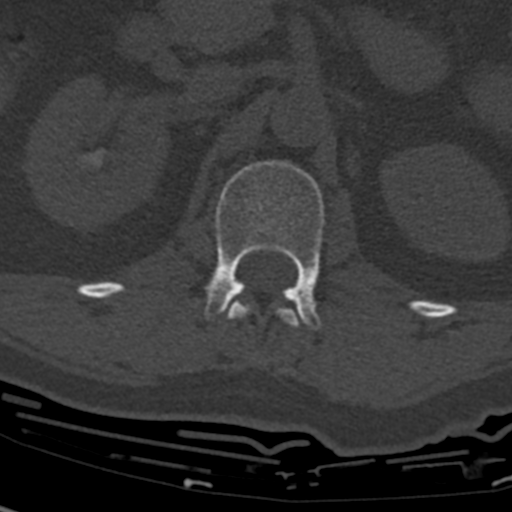
[im 132/172  bone]
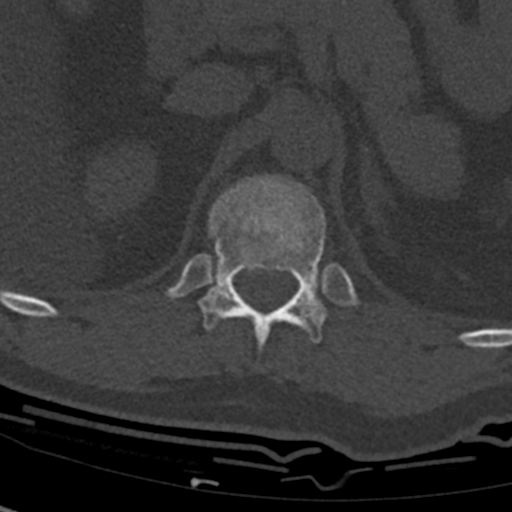
[im 158/172  bone]
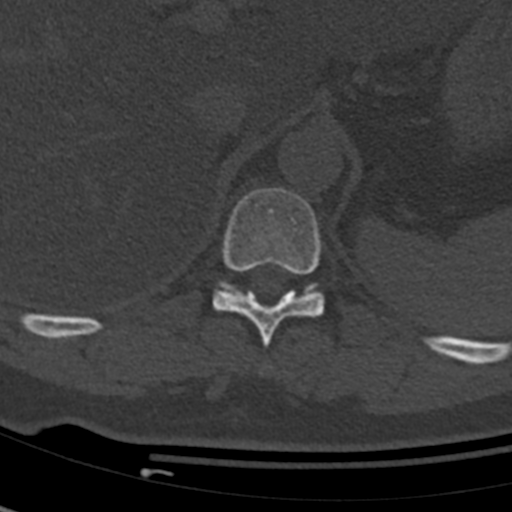

[Series 7: coronal bone · coronal · 0.26mm/px · 3 of 83 slices shown]
[im 17/83  bone]
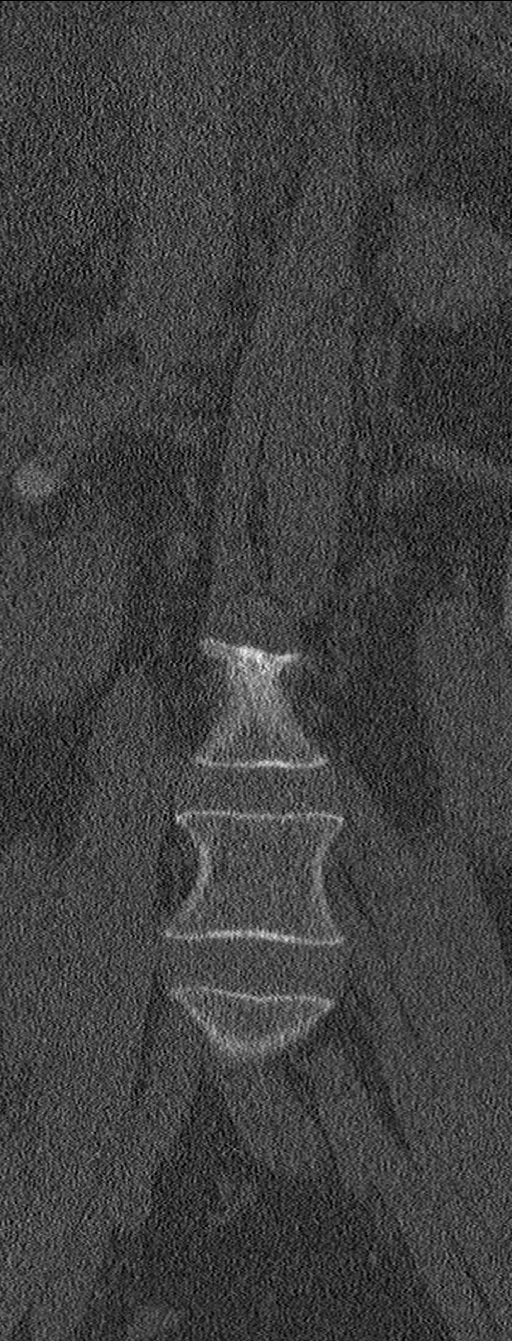
[im 33/83  bone]
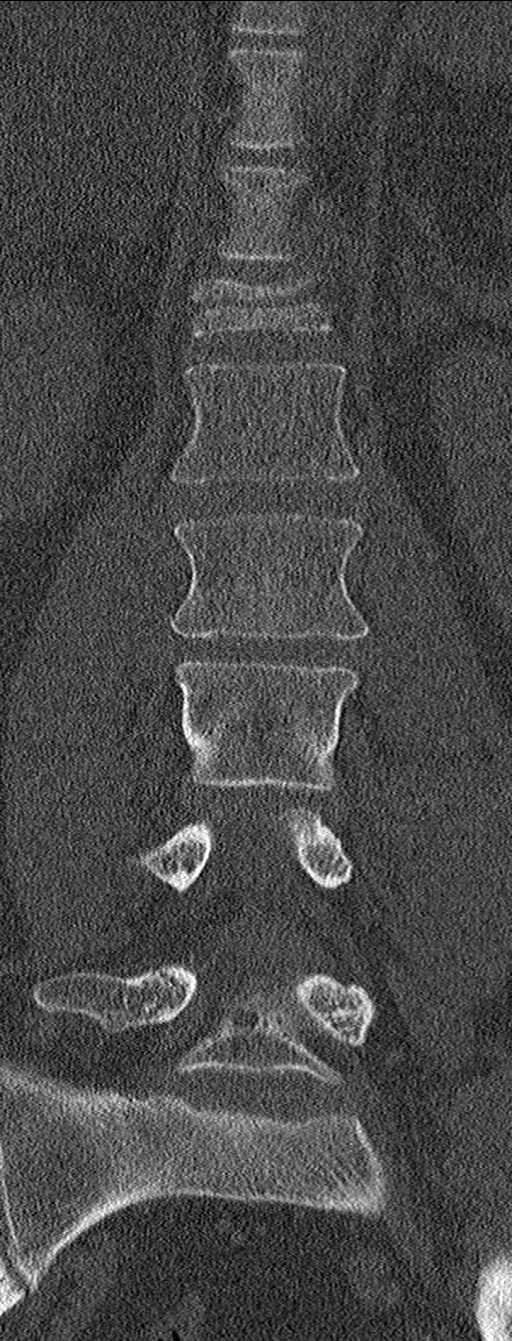
[im 50/83  bone]
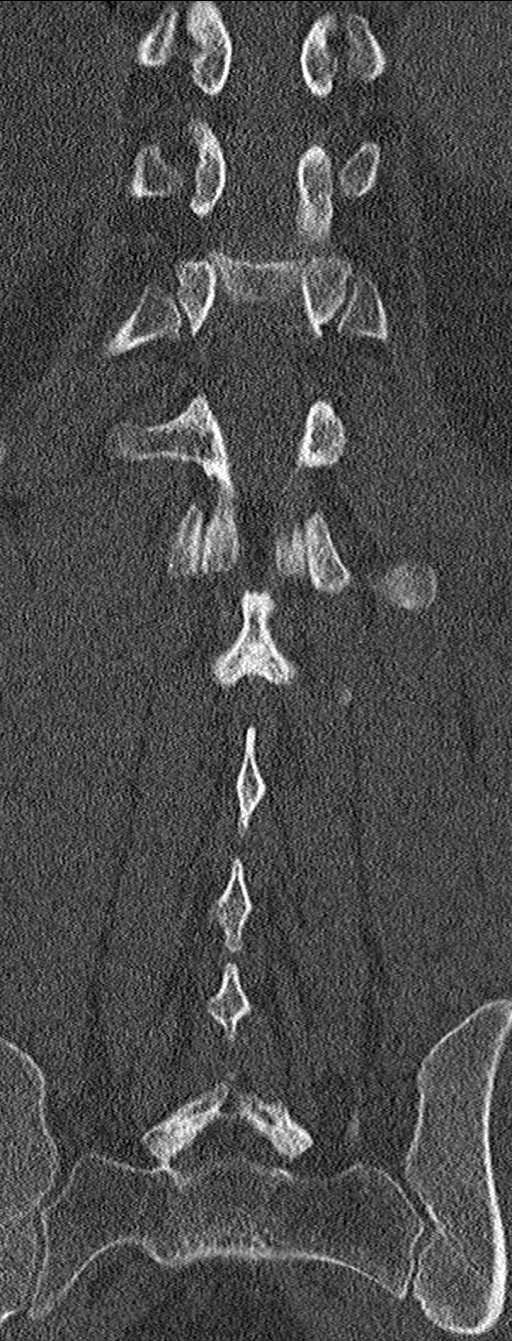

[11 of 33 positions shown; findings below may reference images not displayed]

FINDINGS: Segmentation: 5 lumbar type vertebrae.

Alignment: No acute subluxation.  Normal lumbar lordosis.

Vertebrae: No acute lumbar spine fracture. Minimal old compression
fracture of the inferior endplate of L3 versus a Schmorl's node.

There is compression fracture of the superior endplate of T12 with
greater than 50% loss of vertebral body height. This is likely acute
or possibly subacute. There is transmits component of the fracture
that extends to the anterior vertebral body cortex. There is
approximately 7 mm retropulsion of the superior posterior cortex
with associated moderate focal narrowing of the central canal. There
is also mild compression fracture of the superior endplate of T11
with less than 20% loss of vertebral body height centrally. No
retropulsed fragment noted at this level. The visualized posterior
elements appear intact.

Paraspinal and other soft tissues: No paravertebral fluid collection
or hematoma. There is a horseshoe morphology of the kidney with
fusion of the inferior pole. There is symmetric excretion of
contrast by both kidneys. There is fatty infiltration of the liver.

Disc levels: Mild degenerative changes and bone spur at L2-L3. The
neural foramina appear patent.
IMPRESSION: 1. No acute/traumatic lumbar spine pathology.
2. Compression fracture of the T12 with greater than 50% loss of
vertebral body height, likely acute or possibly subacute. There is a
7 mm retropulsed fragment of the superior posterior cortex with
associated moderate focal narrowing of the central canal.
3. Compression fracture of the superior endplate of T11 with less
than 20% loss of vertebral body height. No retropulsed fragment at
this level.
4. Fatty liver and horseshoe kidney.
